# Patient Record
Sex: Male | Born: 1955 | Race: White | Hispanic: No | Marital: Married | Smoking: Current every day smoker
Health system: Southern US, Community
[De-identification: ages and names within clinical notes are randomized; demographics above are authoritative.]

## PROBLEM LIST (undated history)

## (undated) DIAGNOSIS — B1920 Unspecified viral hepatitis C without hepatic coma: Secondary | ICD-10-CM

## (undated) HISTORY — PX: NECK SURGERY: SHX720

## (undated) HISTORY — DX: Unspecified viral hepatitis C without hepatic coma: B19.20

---

## 2004-06-06 ENCOUNTER — Ambulatory Visit: Payer: Self-pay | Admitting: Cardiology

## 2004-06-11 ENCOUNTER — Ambulatory Visit: Payer: Self-pay | Admitting: Cardiology

## 2004-06-12 ENCOUNTER — Ambulatory Visit: Payer: Self-pay | Admitting: Cardiology

## 2004-06-13 ENCOUNTER — Inpatient Hospital Stay (HOSPITAL_BASED_OUTPATIENT_CLINIC_OR_DEPARTMENT_OTHER): Admission: RE | Admit: 2004-06-13 | Discharge: 2004-06-13 | Payer: Self-pay | Admitting: Cardiovascular Disease

## 2004-06-13 ENCOUNTER — Ambulatory Visit: Payer: Self-pay | Admitting: Cardiovascular Disease

## 2004-06-20 ENCOUNTER — Ambulatory Visit: Payer: Self-pay | Admitting: Cardiology

## 2004-11-29 ENCOUNTER — Inpatient Hospital Stay (HOSPITAL_COMMUNITY): Admission: RE | Admit: 2004-11-29 | Discharge: 2004-11-30 | Payer: Self-pay | Admitting: Neurosurgery

## 2010-09-18 ENCOUNTER — Ambulatory Visit (INDEPENDENT_AMBULATORY_CARE_PROVIDER_SITE_OTHER): Payer: Managed Care, Other (non HMO) | Admitting: Internal Medicine

## 2010-09-18 DIAGNOSIS — B182 Chronic viral hepatitis C: Secondary | ICD-10-CM

## 2010-10-02 ENCOUNTER — Other Ambulatory Visit (INDEPENDENT_AMBULATORY_CARE_PROVIDER_SITE_OTHER): Payer: Self-pay | Admitting: Internal Medicine

## 2010-10-02 DIAGNOSIS — B182 Chronic viral hepatitis C: Secondary | ICD-10-CM

## 2010-10-12 ENCOUNTER — Other Ambulatory Visit (INDEPENDENT_AMBULATORY_CARE_PROVIDER_SITE_OTHER): Payer: Self-pay | Admitting: Internal Medicine

## 2010-10-12 ENCOUNTER — Ambulatory Visit (HOSPITAL_COMMUNITY)
Admission: RE | Admit: 2010-10-12 | Discharge: 2010-10-12 | Disposition: A | Discharge status: Home / Self Care | Payer: Managed Care, Other (non HMO) | Source: Ambulatory Visit | Attending: Internal Medicine | Admitting: Internal Medicine

## 2010-10-12 ENCOUNTER — Other Ambulatory Visit: Payer: Self-pay | Admitting: Interventional Radiology

## 2010-10-12 ENCOUNTER — Ambulatory Visit (HOSPITAL_COMMUNITY): Payer: Managed Care, Other (non HMO)

## 2010-10-12 DIAGNOSIS — B182 Chronic viral hepatitis C: Secondary | ICD-10-CM

## 2010-10-12 LAB — CBC
HCT: 47.5 % (ref 39.0–52.0)
MCH: 31.5 pg (ref 26.0–34.0)
MCV: 88.5 fL (ref 78.0–100.0)
RDW: 13.2 % (ref 11.5–15.5)
WBC: 7 10*3/uL (ref 4.0–10.5)

## 2011-10-17 ENCOUNTER — Encounter (INDEPENDENT_AMBULATORY_CARE_PROVIDER_SITE_OTHER): Payer: Self-pay | Admitting: *Deleted

## 2011-10-29 ENCOUNTER — Encounter (INDEPENDENT_AMBULATORY_CARE_PROVIDER_SITE_OTHER): Payer: Managed Care, Other (non HMO) | Admitting: Internal Medicine

## 2012-09-16 ENCOUNTER — Telehealth (INDEPENDENT_AMBULATORY_CARE_PROVIDER_SITE_OTHER): Payer: Self-pay | Admitting: *Deleted

## 2012-09-16 ENCOUNTER — Encounter (INDEPENDENT_AMBULATORY_CARE_PROVIDER_SITE_OTHER): Payer: Self-pay | Admitting: Internal Medicine

## 2012-09-16 ENCOUNTER — Ambulatory Visit (INDEPENDENT_AMBULATORY_CARE_PROVIDER_SITE_OTHER): Payer: Managed Care, Other (non HMO) | Admitting: Internal Medicine

## 2012-09-16 ENCOUNTER — Other Ambulatory Visit (INDEPENDENT_AMBULATORY_CARE_PROVIDER_SITE_OTHER): Payer: Self-pay | Admitting: *Deleted

## 2012-09-16 VITALS — BP 94/56 | HR 72 | Temp 99.2°F | Ht 69.0 in | Wt 186.8 lb

## 2012-09-16 DIAGNOSIS — Z1211 Encounter for screening for malignant neoplasm of colon: Secondary | ICD-10-CM

## 2012-09-16 DIAGNOSIS — B1920 Unspecified viral hepatitis C without hepatic coma: Secondary | ICD-10-CM

## 2012-09-16 LAB — CBC WITH DIFFERENTIAL/PLATELET
Basophils Absolute: 0.1 10*3/uL (ref 0.0–0.1)
Eosinophils Absolute: 0.2 10*3/uL (ref 0.0–0.7)
Eosinophils Relative: 3 % (ref 0–5)
Lymphs Abs: 3.1 10*3/uL (ref 0.7–4.0)
MCH: 31.5 pg (ref 26.0–34.0)
Monocytes Relative: 8 % (ref 3–12)
Neutro Abs: 3.3 10*3/uL (ref 1.7–7.7)
Neutrophils Relative %: 45 % (ref 43–77)
RDW: 14 % (ref 11.5–15.5)
WBC: 7.3 10*3/uL (ref 4.0–10.5)

## 2012-09-16 MED ORDER — PEG-KCL-NACL-NASULF-NA ASC-C 100 G PO SOLR: 1.0000 | Freq: Once | ORAL | Status: DC

## 2012-09-16 NOTE — Progress Notes (Signed)
Subjective:     Patient ID: Ian Stout, male   DOB: [birth year], 57 y.o.   MRN: [id number]  HPIReferred to our office for a screening colonoscopy. He tells me his sister had a colonoscopy with polyps 2 yrs ago.  Also referred for Hepatitis C. Appetite is good. No weight loss. No abdominal pain. BMs are normal. No melena or bright red rectal bleeding.     Diagnosed in 2012 with Hep. C. (Per Dr Patty Sermons notes, he found that his girlfriend years ago had Hepatitis C). He has never received blood transfusions, or tattoos. No history of IV drug [place]. No hx of icteric hepatitis.  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  09/25/2010 Hepatitis C RNA Quant 576000. Genotype 1a HCV antibody greater than 11 (high) 07/18/2010 AST 109, ALT 228, ALP 85  07/20/2010 HBsAg screen negative, Hep B core Ab negative. Review of Systems Current Outpatient Prescriptions  Medication Sig Dispense Refill  . albuterol (PROVENTIL HFA;VENTOLIN HFA) 108 (90 BASE) MCG/ACT inhaler Inhale 2 puffs into the lungs every 6 (six) hours as needed for wheezing.      . sildenafil (VIAGRA) 100 MG tablet Take 100 mg by mouth as needed for erectile dysfunction.      . varenicline (CHANTIX PAK) 0.5 MG X 11 & 1 MG X 42 tablet Take by mouth 2 (two) times daily. Take one 0.5 mg tablet by mouth once daily for 3 days, then increase to one 0.5 mg tablet twice daily for 4 days, then increase to one 1 mg tablet twice daily.       No current facility-administered medications for this visit.   Past Medical History  Diagnosis Date  . Hepatitis C     diagnosed in 2012   Past Surgical History  Procedure Laterality Date  . Neck surgery      bone spur   No Known Allergies      Objective:   Physical Exam  Filed Vitals:   09/16/12 1419  BP: 94/56  Pulse: 72  Temp: 99.2 F (37.3 C)  Height: 5\' 9"  (1.753 m)  Weight: 186 lb 12.8 oz (84.732 kg)    Alert and oriented. Skin warm and dry. Oral mucosa is moist.   . Sclera anicteric, conjunctivae is pink. Thyroid not enlarged. No cervical lymphadenopathy. Lungs clear. Heart regular rate and rhythm.  Abdomen is soft. Bowel sounds are positive. No hepatomegaly. No abdominal masses felt. No tenderness.  No edema to lower extremities.       Assessment:    Hepatitis C.  Screening colonoscopy    Plan:   CBC, PT/INR, AFP, Hep C RNA, cmet OV in  1-2 month with Dr. Karilyn Cota to discuss treatment options.

## 2012-09-16 NOTE — Patient Instructions (Addendum)
CBC, PT/INR, CMET, AFP, Hep C Quant.  OV in 1 month with Dr. Karilyn Cota.

## 2012-09-16 NOTE — Telephone Encounter (Signed)
Patient needs movi prep 

## 2012-09-17 LAB — AFP TUMOR MARKER: AFP-Tumor Marker: 2.5 ng/mL (ref 0.0–8.0)

## 2012-09-17 LAB — PROTIME-INR
INR: 1 (ref <1.50)
Prothrombin Time: 13.2 seconds (ref 11.6–15.2)

## 2012-09-17 LAB — COMPREHENSIVE METABOLIC PANEL
ALT: 82 U/L — ABNORMAL HIGH (ref 0–53)
AST: 47 U/L — ABNORMAL HIGH (ref 0–37)
Albumin: 4.3 g/dL (ref 3.5–5.2)
Alkaline Phosphatase: 75 U/L (ref 39–117)
BUN: 18 mg/dL (ref 6–23)
CO2: 29 mEq/L (ref 19–32)
Calcium: 9.4 mg/dL (ref 8.4–10.5)
Chloride: 102 mEq/L (ref 96–112)
Creat: 0.67 mg/dL (ref 0.50–1.35)
Glucose, Bld: 95 mg/dL (ref 70–99)
Potassium: 4.3 mEq/L (ref 3.5–5.3)
Sodium: 139 mEq/L (ref 135–145)
Total Bilirubin: 0.7 mg/dL (ref 0.3–1.2)
Total Protein: 7.4 g/dL (ref 6.0–8.3)

## 2012-09-18 ENCOUNTER — Encounter (HOSPITAL_COMMUNITY): Payer: Self-pay | Admitting: Pharmacy Technician

## 2012-09-21 ENCOUNTER — Encounter (INDEPENDENT_AMBULATORY_CARE_PROVIDER_SITE_OTHER): Payer: Self-pay | Admitting: *Deleted

## 2012-10-13 ENCOUNTER — Encounter (INDEPENDENT_AMBULATORY_CARE_PROVIDER_SITE_OTHER): Payer: Self-pay

## 2012-10-29 MED ORDER — SODIUM CHLORIDE 0.45 % IV SOLN: INTRAVENOUS | Status: DC

## 2012-11-02 ENCOUNTER — Encounter (HOSPITAL_COMMUNITY)
Admission: RE | Disposition: A | Discharge status: Home / Self Care | Payer: Self-pay | Source: Ambulatory Visit | Attending: Internal Medicine

## 2012-11-02 ENCOUNTER — Encounter (HOSPITAL_COMMUNITY): Payer: Self-pay | Admitting: *Deleted

## 2012-11-02 ENCOUNTER — Ambulatory Visit (HOSPITAL_COMMUNITY)
Admission: RE | Admit: 2012-11-02 | Discharge: 2012-11-02 | Disposition: A | Discharge status: Home / Self Care | Payer: Managed Care, Other (non HMO) | Source: Ambulatory Visit | Attending: Internal Medicine | Admitting: Internal Medicine

## 2012-11-02 DIAGNOSIS — Z8371 Family history of colonic polyps: Secondary | ICD-10-CM

## 2012-11-02 DIAGNOSIS — F17200 Nicotine dependence, unspecified, uncomplicated: Secondary | ICD-10-CM | POA: Insufficient documentation

## 2012-11-02 DIAGNOSIS — Z83719 Family history of colon polyps, unspecified: Secondary | ICD-10-CM | POA: Insufficient documentation

## 2012-11-02 DIAGNOSIS — Z79899 Other long term (current) drug therapy: Secondary | ICD-10-CM | POA: Insufficient documentation

## 2012-11-02 DIAGNOSIS — D126 Benign neoplasm of colon, unspecified: Secondary | ICD-10-CM | POA: Insufficient documentation

## 2012-11-02 DIAGNOSIS — Z1211 Encounter for screening for malignant neoplasm of colon: Secondary | ICD-10-CM

## 2012-11-02 DIAGNOSIS — B1920 Unspecified viral hepatitis C without hepatic coma: Secondary | ICD-10-CM | POA: Insufficient documentation

## 2012-11-02 HISTORY — PX: COLONOSCOPY: SHX5424

## 2012-11-02 SURGERY — COLONOSCOPY
Anesthesia: Moderate Sedation

## 2012-11-02 MED ORDER — STERILE WATER FOR IRRIGATION IR SOLN
Status: DC | PRN
  Administered 2012-11-02: 08:00:00

## 2012-11-02 MED ORDER — MEPERIDINE HCL 50 MG/ML IJ SOLN
INTRAMUSCULAR | Status: AC
  Filled 2012-11-02: qty 1

## 2012-11-02 MED ORDER — MIDAZOLAM HCL 5 MG/5ML IJ SOLN
INTRAMUSCULAR | Status: DC | PRN
  Administered 2012-11-02: 2 mg via INTRAVENOUS
  Administered 2012-11-02: 3 mg via INTRAVENOUS
  Administered 2012-11-02: 2 mg via INTRAVENOUS

## 2012-11-02 MED ORDER — MEPERIDINE HCL 50 MG/ML IJ SOLN
INTRAMUSCULAR | Status: DC | PRN
  Administered 2012-11-02 (×2): 25 mg via INTRAVENOUS

## 2012-11-02 MED ORDER — SODIUM CHLORIDE 0.9 % IV SOLN
INTRAVENOUS | Status: DC
  Administered 2012-11-02: 08:00:00 via INTRAVENOUS

## 2012-11-02 MED ORDER — MIDAZOLAM HCL 5 MG/5ML IJ SOLN
INTRAMUSCULAR | Status: AC
  Filled 2012-11-02: qty 10

## 2012-11-02 NOTE — H&P (Signed)
Ian Stout is an 57 y.o. male.   Chief Complaint: Patient is here for colonoscopy. HPI: Patient is 57 year old Caucasian male who is in for screening colonoscopy. He denies abdominal pain changes bowel habits or rectal bleeding. He has a sister was 7 years older has had colonic polyps removed. Family history is negative for colorectal carcinoma.  Past Medical History  Diagnosis Date  . Hepatitis C     diagnosed in 2012    Past Surgical History  Procedure Laterality Date  . Neck surgery      bone spur    Family History  Problem Relation Age of Onset  . Colon polyps Sister   . Colon cancer Neg Hx    Social History:  reports that he has been smoking.  He does not have any smokeless tobacco history on file. He reports that  drinks alcohol. He reports that he does not use illicit drugs.  Allergies: No Known Allergies  Medications Prior to Admission  Medication Sig Dispense Refill  . albuterol (PROVENTIL HFA;VENTOLIN HFA) 108 (90 BASE) MCG/ACT inhaler Inhale 2 puffs into the lungs every 6 (six) hours as needed for wheezing.      . peg 3350 powder (MOVIPREP) 100 G SOLR Take 1 kit (100 g total) by mouth once.  1 kit  0  . sildenafil (VIAGRA) 100 MG tablet Take 100 mg by mouth as needed for erectile dysfunction.      . varenicline (CHANTIX PAK) 0.5 MG X 11 & 1 MG X 42 tablet Take by mouth 2 (two) times daily. Take one 0.5 mg tablet by mouth once daily for 3 days, then increase to one 0.5 mg tablet twice daily for 4 days, then increase to one 1 mg tablet twice daily.        No results found for this or any previous visit (from the past 48 hour(s)). No results found.  ROS  Blood pressure 125/78, temperature 97.4 F (36.3 C), temperature source Oral, resp. rate 19, height 5\' 9"  (1.753 m), weight 180 lb (81.647 kg), SpO2 94.00%. Physical Exam  Constitutional: He appears well-developed and well-nourished.  HENT:  Mouth/Throat: Oropharynx is clear and moist.  Eyes: Conjunctivae  are normal. No scleral icterus.  Neck: No thyromegaly present.  Cardiovascular: Normal rate, regular rhythm and normal heart sounds.   No murmur heard. Respiratory: Effort normal and breath sounds normal.  GI: Soft. He exhibits no distension and no mass. There is no tenderness.  Musculoskeletal: He exhibits no edema.  Lymphadenopathy:    He has no cervical adenopathy.  Neurological: He is alert.  Skin: Skin is warm and dry.     Assessment/Plan Average risk screening colonoscopy.  Byron Tipping U 11/02/2012, 7:42 AM

## 2012-11-02 NOTE — Op Note (Signed)
COLONOSCOPY PROCEDURE REPORT  PATIENT:  Ian Stout  MR#:  [id number] Birthdate:  December 16, 1955, 57 y.o., male Endoscopist:  Dr. Malissa Hippo, MD Referred By:  Dr. Donzetta Sprung, MD  Procedure Date: 11/02/2012  Procedure:   Colonoscopy  Indications:  Patient is 57 year old Caucasian male was undergoing average risk screening colonoscopy. His sister who is 7 years older had 2 polyps removed 2 years ago.  Informed Consent:  The procedure and risks were reviewed with the patient and informed consent was obtained.  Medications:  Demerol 50 mg IV Versed 7 mg IV  Description of procedure:  After a digital rectal exam was performed, that colonoscope was advanced from the anus through the rectum and colon to the area of the cecum, ileocecal valve and appendiceal orifice. The cecum was deeply intubated. These structures were well-seen and photographed for the record. From the level of the cecum and ileocecal valve, the scope was slowly and cautiously withdrawn. The mucosal surfaces were carefully surveyed utilizing scope tip to flexion to facilitate fold flattening as needed. The scope was pulled down into the rectum where a thorough exam including retroflexion was performed.  Findings:   Prep excellent Small polyp ablated via cold biopsy from mid sigmoid colon. Normal rectal mucosa and anal rectal junction.   Therapeutic/Diagnostic Maneuvers Performed:  See above  Complications:  None  Cecal Withdrawal Time:  9 minutes  Impression:  Examination performed to cecum. Small polyp at mid sigmoid colon otherwise normal colonoscopy. This polyp was removed via cold biopsy.  Recommendations:  Standard instructions given. I will contact patient with biopsy results and further recommendations.  REHMAN,NAJEEB U  11/02/2012 8:07 AM  CC: Dr. Donzetta Sprung, MD & Dr. Bonnetta Barry ref. provider found

## 2012-11-03 ENCOUNTER — Encounter (HOSPITAL_COMMUNITY): Payer: Self-pay | Admitting: Internal Medicine

## 2012-11-10 ENCOUNTER — Encounter (INDEPENDENT_AMBULATORY_CARE_PROVIDER_SITE_OTHER): Payer: Self-pay | Admitting: *Deleted

## 2012-11-16 ENCOUNTER — Ambulatory Visit (INDEPENDENT_AMBULATORY_CARE_PROVIDER_SITE_OTHER): Payer: Managed Care, Other (non HMO) | Admitting: Internal Medicine

## 2012-11-19 ENCOUNTER — Encounter (INDEPENDENT_AMBULATORY_CARE_PROVIDER_SITE_OTHER): Payer: Self-pay | Admitting: Internal Medicine

## 2012-11-19 ENCOUNTER — Ambulatory Visit (INDEPENDENT_AMBULATORY_CARE_PROVIDER_SITE_OTHER): Payer: Managed Care, Other (non HMO) | Admitting: Internal Medicine

## 2012-11-19 VITALS — BP 100/58 | HR 64 | Ht 69.0 in | Wt 180.4 lb

## 2012-11-19 DIAGNOSIS — B1920 Unspecified viral hepatitis C without hepatic coma: Secondary | ICD-10-CM

## 2012-11-19 NOTE — Progress Notes (Signed)
Subjective:     Patient ID: Ian Stout, male   DOB: 05/15/1956, 57 y.o.   MRN: [id number]  HPI Here today to discuss possible Hep C tx. Diagnosed in 2012.  Marland Kitchen He does not have a hx of IV drug use.  He had a girlfriend years ago with Hep C.  No blood transfusion or tattoos. He is genotype 1A.  Liver Biopsy 09/15/10: Chronic Hepatitis, mildly active, (Grade 2) with portal fibrosis (stage 1).  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  09/25/2010 Hepatitis C RNA Quant 576000.  Genotype 1a  HCV antibody greater than 11 (high)  07/18/2010 AST 109, ALT 228, ALP 85  07/20/2010 HBsAg screen negative, Hep B core Ab negative.      Review of Systems  See hpi Current Outpatient Prescriptions  Medication Sig Dispense Refill  . albuterol (PROVENTIL HFA;VENTOLIN HFA) 108 (90 BASE) MCG/ACT inhaler Inhale 2 puffs into the lungs every 6 (six) hours as needed for wheezing.      . sildenafil (VIAGRA) 100 MG tablet Take 100 mg by mouth as needed for erectile dysfunction.       No current facility-administered medications for this visit.   Past Medical History  Diagnosis Date  . Hepatitis C     diagnosed in 2012   Past Surgical History  Procedure Laterality Date  . Neck surgery      bone spur  . Colonoscopy N/A 11/02/2012    Procedure: COLONOSCOPY;  Surgeon: Malissa Hippo, MD;  Location: [facility];  Service: Endoscopy;  Laterality: N/A;  100-rescheduled to [address] notified pt   No Known Allergies       Objective:   Physical Exam  Filed Vitals:   11/19/12 0954  BP: 100/58  Pulse: 64  Height: 5\' 9"  (1.753 m)  Weight: 180 lb 6.4 oz (81.829 kg)  Alert and oriented. Skin warm and dry. Oral mucosa is moist.   . Sclera anicteric, conjunctivae is pink. Thyroid not enlarged. No cervical lymphadenopathy. Lungs clear. Heart regular rate and rhythm.  Abdomen is soft. Bowel sounds are positive. No hepatomegaly. No abdominal masses felt. No  tenderness.  No edema to lower extremities.        Assessment:     Hepatitis C. Genotype  He has a mild disease at this time. I discussed this case with  Dr. Karilyn Cota.    Plan:     OV in February. Hopefully newer Hep C tx will be available. Patient has minimal fibrosis.

## 2012-11-19 NOTE — Patient Instructions (Signed)
OV in February

## 2013-08-23 ENCOUNTER — Encounter (INDEPENDENT_AMBULATORY_CARE_PROVIDER_SITE_OTHER): Payer: Self-pay | Admitting: Internal Medicine

## 2013-08-23 ENCOUNTER — Ambulatory Visit (INDEPENDENT_AMBULATORY_CARE_PROVIDER_SITE_OTHER): Payer: Managed Care, Other (non HMO) | Admitting: Internal Medicine

## 2013-08-23 ENCOUNTER — Other Ambulatory Visit (INDEPENDENT_AMBULATORY_CARE_PROVIDER_SITE_OTHER): Payer: Self-pay | Admitting: Internal Medicine

## 2013-08-23 VITALS — BP 114/66 | HR 64 | Temp 98.3°F | Ht 69.0 in | Wt 189.4 lb

## 2013-08-23 DIAGNOSIS — B1920 Unspecified viral hepatitis C without hepatic coma: Secondary | ICD-10-CM

## 2013-08-23 LAB — TSH: TSH: 0.886 u[IU]/mL (ref 0.350–4.500)

## 2013-08-23 LAB — CBC WITH DIFFERENTIAL/PLATELET
BASOS PCT: 1 % (ref 0–1)
Basophils Absolute: 0 10*3/uL (ref 0.0–0.1)
EOS ABS: 0.2 10*3/uL (ref 0.0–0.7)
EOS PCT: 4 % (ref 0–5)
HCT: 48.2 % (ref 39.0–52.0)
Hemoglobin: 17.1 g/dL — ABNORMAL HIGH (ref 13.0–17.0)
LYMPHS ABS: 2.6 10*3/uL (ref 0.7–4.0)
Lymphocytes Relative: 39 % (ref 12–46)
MCH: 32 pg (ref 26.0–34.0)
MCHC: 35.5 g/dL (ref 30.0–36.0)
MCV: 90.3 fL (ref 78.0–100.0)
MONOS PCT: 7 % (ref 3–12)
Monocytes Absolute: 0.4 10*3/uL (ref 0.1–1.0)
Neutro Abs: 3.4 10*3/uL (ref 1.7–7.7)
Neutrophils Relative %: 49 % (ref 43–77)
PLATELETS: 168 10*3/uL (ref 150–400)
RBC: 5.34 MIL/uL (ref 4.22–5.81)
RDW: 14 % (ref 11.5–15.5)
WBC: 6.8 10*3/uL (ref 4.0–10.5)

## 2013-08-23 LAB — COMPREHENSIVE METABOLIC PANEL
ALBUMIN: 4.2 g/dL (ref 3.5–5.2)
ALK PHOS: 67 U/L (ref 39–117)
ALT: 66 U/L — AB (ref 0–53)
AST: 37 U/L (ref 0–37)
BUN: 17 mg/dL (ref 6–23)
CHLORIDE: 102 meq/L (ref 96–112)
CO2: 31 mEq/L (ref 19–32)
Calcium: 9.4 mg/dL (ref 8.4–10.5)
Creat: 0.69 mg/dL (ref 0.50–1.35)
Glucose, Bld: 97 mg/dL (ref 70–99)
POTASSIUM: 4.7 meq/L (ref 3.5–5.3)
SODIUM: 141 meq/L (ref 135–145)
Total Bilirubin: 0.7 mg/dL (ref 0.2–1.2)
Total Protein: 7.3 g/dL (ref 6.0–8.3)

## 2013-08-23 NOTE — Progress Notes (Signed)
Subjective:     Patient ID: Ian Stout, male   DOB: 04-21-1956, 58 y.o.   MRN: [id number]  HPI Here today for f/u of his Hepatitis C.  Diagnosed in 2012 with Hepatitis. No risk factors.  No tattoos. No IV drug use.  He does tell me an old girlfriend had Hepatitis C 30 yrs ago.  Appetite is good. No weight loss. No abdominal pain.  No jaundice. BMs are normal. No melena or bright red rectal bleeding. Genotype 1A.  10/12/2010 Liver biopsy: Chronic hepatitis, mildly active (grade 2) with portal fibrosis (stage 1).    09/16/2012 HCV Quantitative <15 IU/mL  [id number] (H)   HCV Quantitative Log <1.18 log 10  6.76 (H)   Comments: Genotype 1a  07/20/2010 HBsAg screen negative, Hep B core Ab negative.   CMP     Component Value Date/Time   NA 139 09/16/2012 0305   K 4.3 09/16/2012 0305   CL 102 09/16/2012 0305   CO2 29 09/16/2012 0305   GLUCOSE 95 09/16/2012 0305   BUN 18 09/16/2012 0305   CREATININE 0.67 09/16/2012 0305   CALCIUM 9.4 09/16/2012 0305   PROT 7.4 09/16/2012 0305   ALBUMIN 4.3 09/16/2012 0305   AST 47* 09/16/2012 0305   ALT 82* 09/16/2012 0305   ALKPHOS 75 09/16/2012 0305   BILITOT 0.7 09/16/2012 0305   CBC    Component Value Date/Time   WBC 7.3 09/16/2012 0305   RBC 5.30 09/16/2012 0305   HGB 16.7 09/16/2012 0305   HCT 47.5 09/16/2012 0305   PLT 153 09/16/2012 0305   MCV 89.6 09/16/2012 0305   MCH 31.5 09/16/2012 0305   MCHC 35.2 09/16/2012 0305   RDW 14.0 09/16/2012 0305   LYMPHSABS 3.1 09/16/2012 0305   MONOABS 0.6 09/16/2012 0305   EOSABS 0.2 09/16/2012 0305   BASOSABS 0.1 09/16/2012 0305   .     Review of Systems see hpi Past Medical History  Diagnosis Date  . Hepatitis C     diagnosed in 2012   Past Surgical History  Procedure Laterality Date  . Neck surgery      bone spur  . Colonoscopy N/A 11/02/2012    Procedure: COLONOSCOPY;  Surgeon: Rogene Houston, MD;  Location: [facility];  Service: Endoscopy;  Laterality: N/A;  100-rescheduled to [place] notified pt   No Known Allergies     Objective:   Physical Exam  Filed Vitals:   08/23/13 0926  BP: 114/66  Pulse: 64  Temp: 98.3 F (36.8 C)  Height: 5\' 9"  (1.753 m)  Weight: 189 lb 6.4 oz (85.911 kg)   Alert and oriented. Skin warm and dry. Oral mucosa is moist.   . Sclera anicteric, conjunctivae is pink. Thyroid not enlarged. No cervical lymphadenopathy. Lungs clear. Heart regular rate and rhythm.  Abdomen is soft. Bowel sounds are positive. No hepatomegaly. No abdominal masses felt. No tenderness.  No edema to lower extremities.       Assessment:   Hepatitis C. Genotype 1A. He has decided he would like treatment.    Plan:     CMET, TSH, CBC, Hep C quaint, [place] rt upper quadrant.  Will treat with Rozann Lesches x 12 weeks. He was advised no etoh and that Viagra was contraindicated during treatment.

## 2013-08-23 NOTE — Patient Instructions (Addendum)
Labs. Will try to get Rozann Lesches approved. Further recommendations to follow.  No Viagra during treatment. No etoh

## 2013-08-24 ENCOUNTER — Ambulatory Visit (HOSPITAL_COMMUNITY)
Admission: RE | Admit: 2013-08-24 | Discharge: 2013-08-24 | Disposition: A | Discharge status: Home / Self Care | Payer: Managed Care, Other (non HMO) | Source: Ambulatory Visit | Attending: Internal Medicine | Admitting: Internal Medicine

## 2013-08-24 ENCOUNTER — Other Ambulatory Visit (INDEPENDENT_AMBULATORY_CARE_PROVIDER_SITE_OTHER): Payer: Self-pay | Admitting: Internal Medicine

## 2013-08-24 DIAGNOSIS — B1920 Unspecified viral hepatitis C without hepatic coma: Secondary | ICD-10-CM | POA: Insufficient documentation

## 2013-08-24 DIAGNOSIS — Q619 Cystic kidney disease, unspecified: Secondary | ICD-10-CM | POA: Insufficient documentation

## 2013-08-24 LAB — HEPATITIS C RNA QUANTITATIVE
HCV QUANT LOG: 6.82 {Log} — AB (ref <1.18)
HCV QUANT: 6630484 [IU]/mL — AB (ref <15)

## 2013-09-17 ENCOUNTER — Telehealth (INDEPENDENT_AMBULATORY_CARE_PROVIDER_SITE_OTHER): Payer: Self-pay | Admitting: Internal Medicine

## 2013-09-17 NOTE — Telephone Encounter (Signed)
Will be taking Harvoni x 12 weeks

## 2013-10-06 ENCOUNTER — Encounter (INDEPENDENT_AMBULATORY_CARE_PROVIDER_SITE_OTHER): Payer: Self-pay | Admitting: Internal Medicine

## 2013-10-08 ENCOUNTER — Telehealth (INDEPENDENT_AMBULATORY_CARE_PROVIDER_SITE_OTHER): Payer: Self-pay | Admitting: *Deleted

## 2013-10-08 NOTE — Telephone Encounter (Signed)
Spoke with [employer]. Patient to get his shipment of medication on 10/12/13. I left a message for the patient to call our office, to let us know the first day he starts the medication.

## 2013-10-12 ENCOUNTER — Encounter (INDEPENDENT_AMBULATORY_CARE_PROVIDER_SITE_OTHER): Payer: Self-pay | Admitting: *Deleted

## 2013-10-12 NOTE — Telephone Encounter (Signed)
Patient called and said he has received his medication. The return phone number is [phone].

## 2013-10-12 NOTE — Telephone Encounter (Signed)
Dearl called to let Tammy know he has received his medicine.

## 2013-10-12 NOTE — Telephone Encounter (Signed)
Error

## 2013-10-12 NOTE — Telephone Encounter (Signed)
This encounter was created in error - please disregard.

## 2013-10-28 ENCOUNTER — Telehealth (INDEPENDENT_AMBULATORY_CARE_PROVIDER_SITE_OTHER): Payer: Self-pay | Admitting: *Deleted

## 2013-10-28 DIAGNOSIS — B1920 Unspecified viral hepatitis C without hepatic coma: Secondary | ICD-10-CM

## 2013-10-28 NOTE — Telephone Encounter (Signed)
Patient was called on his cell phone and his home phone. A message was left about his lab work.

## 2013-10-28 NOTE — Telephone Encounter (Signed)
.  Per Lelon Perla to have labs drawn 2 weeks post starting treatment.

## 2014-01-12 LAB — CBC WITH DIFFERENTIAL/PLATELET
BASOS ABS: 0.1 10*3/uL (ref 0.0–0.1)
BASOS PCT: 1 % (ref 0–1)
EOS PCT: 3 % (ref 0–5)
Eosinophils Absolute: 0.2 10*3/uL (ref 0.0–0.7)
HEMATOCRIT: 46.1 % (ref 39.0–52.0)
Hemoglobin: 16.5 g/dL (ref 13.0–17.0)
Lymphocytes Relative: 36 % (ref 12–46)
Lymphs Abs: 3 10*3/uL (ref 0.7–4.0)
MCH: 31.5 pg (ref 26.0–34.0)
MCHC: 35.8 g/dL (ref 30.0–36.0)
MCV: 88.1 fL (ref 78.0–100.0)
MONO ABS: 0.6 10*3/uL (ref 0.1–1.0)
Monocytes Relative: 7 % (ref 3–12)
Neutro Abs: 4.3 10*3/uL (ref 1.7–7.7)
Neutrophils Relative %: 53 % (ref 43–77)
Platelets: 190 10*3/uL (ref 150–400)
RBC: 5.23 MIL/uL (ref 4.22–5.81)
RDW: 14.1 % (ref 11.5–15.5)
WBC: 8.2 10*3/uL (ref 4.0–10.5)

## 2014-01-12 LAB — HEPATIC FUNCTION PANEL
ALBUMIN: 4.1 g/dL (ref 3.5–5.2)
ALT: 12 U/L (ref 0–53)
AST: 17 U/L (ref 0–37)
Alkaline Phosphatase: 62 U/L (ref 39–117)
Bilirubin, Direct: 0.1 mg/dL (ref 0.0–0.3)
Indirect Bilirubin: 0.7 mg/dL (ref 0.2–1.2)
TOTAL PROTEIN: 6.9 g/dL (ref 6.0–8.3)
Total Bilirubin: 0.8 mg/dL (ref 0.2–1.2)

## 2014-01-14 LAB — HEPATITIS C RNA QUANTITATIVE: HCV QUANT: NOT DETECTED [IU]/mL (ref <15)

## 2014-01-17 ENCOUNTER — Encounter (INDEPENDENT_AMBULATORY_CARE_PROVIDER_SITE_OTHER): Payer: Self-pay | Admitting: Internal Medicine

## 2014-01-17 ENCOUNTER — Ambulatory Visit (INDEPENDENT_AMBULATORY_CARE_PROVIDER_SITE_OTHER): Payer: Managed Care, Other (non HMO) | Admitting: Internal Medicine

## 2014-01-17 VITALS — BP 110/70 | HR 64 | Temp 97.8°F | Ht 69.0 in | Wt 188.0 lb

## 2014-01-17 DIAGNOSIS — B182 Chronic viral hepatitis C: Secondary | ICD-10-CM

## 2014-01-17 NOTE — Progress Notes (Signed)
Subjective:     Patient ID: Ian Stout, male   DOB: 1955/10/24, 58 y.o.   MRN: [id number]  HPI Here today for f/u of his Hepatitis C. Started Harvoni x 12 weeks on April 2, and finished January 07, 2014. of this year.  Diagnosed in 2012 with Hepatitis. No risk factors.  No tattoos. No IV drug use.  He does tell me an old girlfriend had Hepatitis C 30 yrs ago.  Appetite is good. No weight loss. No abdominal pain. No jaundice.  BMs are normal. No melena or bright red rectal bleeding.  Genotype 1A.         07/20/2010 HBsAg screen negative, Hep B core Ab negative.   08/25/2013 US abdomen: IMPRESSION:  Right renal cyst.  Mild nodularity to the liver.    10/12/2010 Liver biopsy: Chronic hepatitis, mildly active (grade 2) with portal fibrosis (stage 1).   Hep C Quaint: undetected. 01/12/2014 CBC    Component Value Date/Time   WBC 8.2 01/12/2014 1059   RBC 5.23 01/12/2014 1059   HGB 16.5 01/12/2014 1059   HCT 46.1 01/12/2014 1059   PLT 190 01/12/2014 1059   MCV 88.1 01/12/2014 1059   MCH 31.5 01/12/2014 1059   MCHC 35.8 01/12/2014 1059   RDW 14.1 01/12/2014 1059   LYMPHSABS 3.0 01/12/2014 1059   MONOABS 0.6 01/12/2014 1059   EOSABS 0.2 01/12/2014 1059   BASOSABS 0.1 01/12/2014 1059   Hepatic Function Latest Ref Rng 01/12/2014 08/23/2013 09/16/2012  Total Protein 6.0 - 8.3 g/dL 6.9 7.3 7.4  Albumin 3.5 - 5.2 g/dL 4.1 4.2 4.3  AST 0 - 37 U/L 17 37 47(H)  ALT 0 - 53 U/L 12 66(H) 82(H)  Alk Phosphatase 39 - 117 U/L 62 67 75  Total Bilirubin 0.2 - 1.2 mg/dL 0.8 0.7 0.7  Bilirubin, Direct 0.0 - 0.3 mg/dL 0.1 - -     Review of Systems     Past Medical History  Diagnosis Date  . Hepatitis C     diagnosed in 2012    Past Surgical History  Procedure Laterality Date  . Neck surgery      bone spur  . Colonoscopy N/A 11/02/2012    Procedure: COLONOSCOPY;  Surgeon: Rogene Houston, MD;  Location: [facility];  Service: Endoscopy;  Laterality: N/A;  100-rescheduled to [place] notified pt    No Known  Allergies  Current Outpatient Prescriptions on File Prior to Visit  Medication Sig Dispense Refill  . albuterol (PROVENTIL HFA;VENTOLIN HFA) 108 (90 BASE) MCG/ACT inhaler Inhale 2 puffs into the lungs every 6 (six) hours as needed for wheezing.      . sildenafil (VIAGRA) 100 MG tablet Take 100 mg by mouth as needed for erectile dysfunction.       No current facility-administered medications on file prior to visit.     Objective:   Physical Exam There were no vitals filed for this visit.  Filed Vitals:   01/17/14 1155  BP: 110/70  Pulse: 64  Temp: 97.8 F (36.6 C)  Height: _0  (1.753 m)  Weight: 188 lb (85.276 kg)  Alert and oriented. Skin warm and dry. Oral mucosa is moist.   . Sclera anicteric, conjunctivae is pink. Thyroid not enlarged. No cervical lymphadenopathy. Lungs clear. Heart regular rate and rhythm.  Abdomen is soft. Bowel sounds are positive. No hepatomegaly. No abdominal masses felt. No tenderness.  No edema to lower extremities.        Assessment/Plan Assessment:  Hepatitis C. Viral load undetectable. Patient is doing well.     Plan:     OV in 6 months.   Will need a Hep C quaint, AFP, CBC, Hepatic function  6 months

## 2014-01-17 NOTE — Patient Instructions (Signed)
OV in 6 months. 

## 2014-01-19 ENCOUNTER — Telehealth (INDEPENDENT_AMBULATORY_CARE_PROVIDER_SITE_OTHER): Payer: Self-pay | Admitting: *Deleted

## 2014-01-19 DIAGNOSIS — B1710 Acute hepatitis C without hepatic coma: Secondary | ICD-10-CM

## 2014-01-19 NOTE — Telephone Encounter (Signed)
.  Per Lelon Perla patient to have labs done 6 months. Noted for January 2016.

## 2014-04-29 ENCOUNTER — Other Ambulatory Visit: Payer: Self-pay

## 2014-06-16 ENCOUNTER — Telehealth (INDEPENDENT_AMBULATORY_CARE_PROVIDER_SITE_OTHER): Payer: Self-pay | Admitting: *Deleted

## 2014-06-16 DIAGNOSIS — B182 Chronic viral hepatitis C: Secondary | ICD-10-CM

## 2014-06-16 NOTE — Telephone Encounter (Signed)
.  Per Lelon Perla to have labs drawn.

## 2014-06-30 LAB — CBC
HCT: 49.2 % (ref 39.0–52.0)
Hemoglobin: 17 g/dL (ref 13.0–17.0)
MCH: 30.8 pg (ref 26.0–34.0)
MCHC: 34.6 g/dL (ref 30.0–36.0)
MCV: 89.1 fL (ref 78.0–100.0)
MPV: 11.3 fL (ref 9.4–12.4)
Platelets: 199 10*3/uL (ref 150–400)
RBC: 5.52 MIL/uL (ref 4.22–5.81)
RDW: 14.2 % (ref 11.5–15.5)
WBC: 9.6 10*3/uL (ref 4.0–10.5)

## 2014-06-30 LAB — HEPATIC FUNCTION PANEL
ALBUMIN: 4.5 g/dL (ref 3.5–5.2)
ALK PHOS: 66 U/L (ref 39–117)
ALT: 11 U/L (ref 0–53)
AST: 14 U/L (ref 0–37)
Bilirubin, Direct: 0.1 mg/dL (ref 0.0–0.3)
Indirect Bilirubin: 0.3 mg/dL (ref 0.2–1.2)
TOTAL PROTEIN: 7.4 g/dL (ref 6.0–8.3)
Total Bilirubin: 0.4 mg/dL (ref 0.2–1.2)

## 2014-06-30 LAB — AFP TUMOR MARKER: AFP TUMOR MARKER: 1.3 ng/mL (ref <6.1)

## 2014-06-30 LAB — HEPATITIS C RNA QUANTITATIVE: HCV Quantitative: NOT DETECTED IU/mL (ref <15)

## 2014-07-11 ENCOUNTER — Ambulatory Visit (INDEPENDENT_AMBULATORY_CARE_PROVIDER_SITE_OTHER): Payer: Managed Care, Other (non HMO) | Admitting: Internal Medicine

## 2014-07-11 ENCOUNTER — Encounter (INDEPENDENT_AMBULATORY_CARE_PROVIDER_SITE_OTHER): Payer: Self-pay | Admitting: Internal Medicine

## 2014-07-11 VITALS — BP 118/64 | HR 72 | Temp 98.6°F | Ht 69.5 in | Wt 187.8 lb

## 2014-07-11 DIAGNOSIS — B1920 Unspecified viral hepatitis C without hepatic coma: Secondary | ICD-10-CM

## 2014-07-11 NOTE — Progress Notes (Signed)
Subjective:    Patient ID: Ian Stout, male    DOB: 1956/06/08, 58 y.o.   MRN: [id number]  HPI Here today for f/u of his Hepatitis C. Finished Harvoni In June of this year. Treated for 12 weeks. Diagnosed with Hepatitis C in 2012. No risk factors. No IV drug use or tatoos. Hx of a girlfriend with Hep C 30 years ago. Genotype 1A. Liver biopsy 10/12/2010 Chronic hepatitis, mild active (grade 2) with portal fibrosis (stage 1). 06/29/2014 Hep C quaint undetected. 01/12/2014 Hep C quaint undetected  Says everything is good. Working full time at [employer] in [place]. No problems. Appetite is good. BMs are normal.  No weight loss. Hepatic function this month is normal. Viral load undetectable     CBC    Component Value Date/Time   WBC 9.6 06/29/2014 1100   RBC 5.52 06/29/2014 1100   HGB 17.0 06/29/2014 1100   HCT 49.2 06/29/2014 1100   PLT 199 06/29/2014 1100   MCV 89.1 06/29/2014 1100   MCH 30.8 06/29/2014 1100   MCHC 34.6 06/29/2014 1100   RDW 14.2 06/29/2014 1100   LYMPHSABS 3.0 01/12/2014 1059   MONOABS 0.6 01/12/2014 1059   EOSABS 0.2 01/12/2014 1059   BASOSABS 0.1 01/12/2014 1059   Hepatic Function Panel     Component Value Date/Time   PROT 7.4 06/29/2014 1100   ALBUMIN 4.5 06/29/2014 1100   AST 14 06/29/2014 1100   ALT 11 06/29/2014 1100   ALKPHOS 66 06/29/2014 1100   BILITOT 0.4 06/29/2014 1100   BILIDIR 0.1 06/29/2014 1100   IBILI 0.3 06/29/2014 1100    .06/29/2014 AFP 1.3        07/20/2010 HBsAg screen negative, Hep B core Ab negative.   08/25/2013 US abdomen: IMPRESSION:  Right renal cyst.  Mild nodularity to the liver.    10/12/2010 Liver biopsy: Chronic hepatitis, mildly active (grade 2) with portal fibrosis (stage 1).       Review of Systems   Past Medical History  Diagnosis Date  . Hepatitis C     diagnosed in 2012    Past Surgical History  Procedure Laterality Date  . Neck surgery      bone spur  . Colonoscopy N/A  11/02/2012    Procedure: COLONOSCOPY;  Surgeon: Rogene Houston, MD;  Location: [facility];  Service: Endoscopy;  Laterality: N/A;  100-rescheduled to [place] notified pt    No Known Allergies  Current Outpatient Prescriptions on File Prior to Visit  Medication Sig Dispense Refill  . albuterol (PROVENTIL HFA;VENTOLIN HFA) 108 (90 BASE) MCG/ACT inhaler Inhale 2 puffs into the lungs every 6 (six) hours as needed for wheezing.    . sildenafil (VIAGRA) 100 MG tablet Take 100 mg by mouth as needed for erectile dysfunction.     No current facility-administered medications on file prior to visit.        Objective:   Physical Exam  Filed Vitals:   07/11/14 1050  Height: 5' 9.5" (1.765 m)  Weight: 187 lb 12.8 oz (85.186 kg)    Alert and oriented. Skin warm and dry. Oral mucosa is moist.   . Sclera anicteric, conjunctivae is pink. Thyroid not enlarged. No cervical lymphadenopathy. Bilateral wheezes. Heart regular rate and rhythm.  Abdomen is soft. Bowel sounds are positive. No hepatomegaly. No abdominal masses felt. No tenderness.  No edema to lower extremities.         Assessment & Plan:  Hepatitis C. He remains in  remission. Viral load undetectable. Hepatic function is normal. OV in one year. ( I wanted to see him back before this but he wanted to wait for a year).

## 2014-07-11 NOTE — Patient Instructions (Addendum)
OV in 1 yr with Hepatic function and Hep C quaint, CBC, AFP

## 2014-07-12 ENCOUNTER — Encounter (INDEPENDENT_AMBULATORY_CARE_PROVIDER_SITE_OTHER): Payer: Self-pay | Admitting: *Deleted

## 2014-07-12 ENCOUNTER — Other Ambulatory Visit (INDEPENDENT_AMBULATORY_CARE_PROVIDER_SITE_OTHER): Payer: Self-pay | Admitting: *Deleted

## 2014-07-12 DIAGNOSIS — B1710 Acute hepatitis C without hepatic coma: Secondary | ICD-10-CM

## 2014-07-26 ENCOUNTER — Ambulatory Visit (INDEPENDENT_AMBULATORY_CARE_PROVIDER_SITE_OTHER): Payer: Managed Care, Other (non HMO) | Admitting: Internal Medicine

## 2015-05-25 IMAGING — US US ABDOMEN LIMITED
1 series · 14 of 25 positions shown · non-contrast
Comparison: None.

CLINICAL DATA: History of hepatitis-C

EXAM:
US ABDOMEN LIMITED - RIGHT UPPER QUADRANT

[Series 1: us abdomen limited · 0.17mm/px · 14 of 49 slices shown]
[im 1/49]
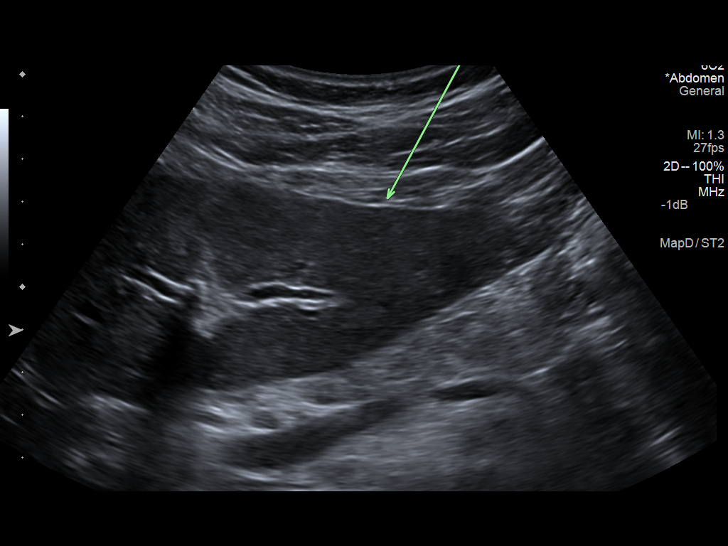
[im 5/49]
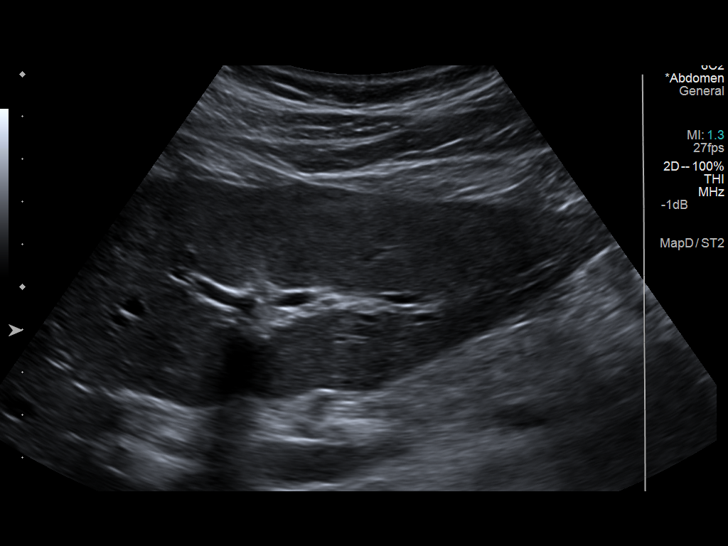
[im 9/49]
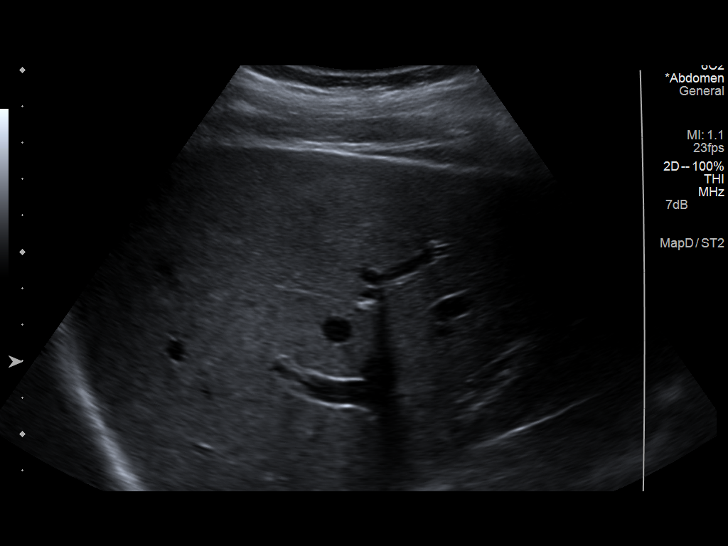
[im 13/49]
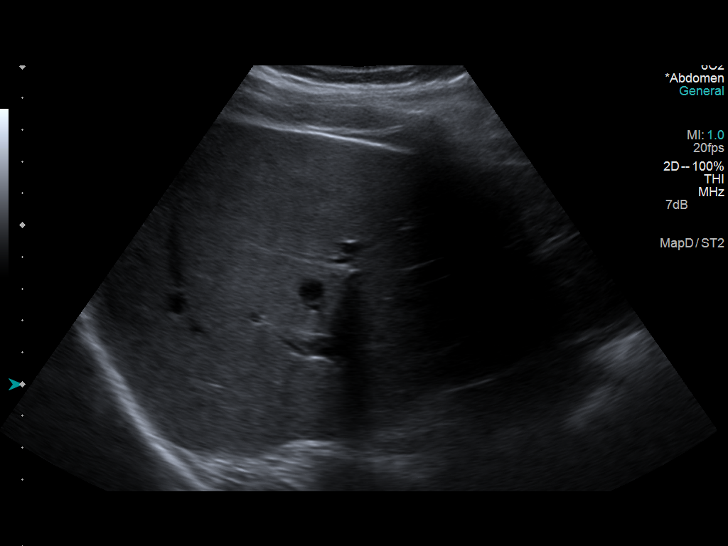
[im 17/49]
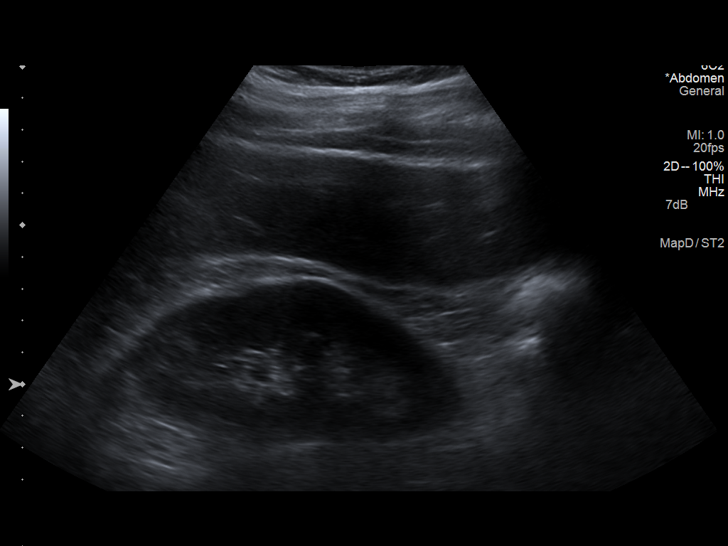
[im 19/49]
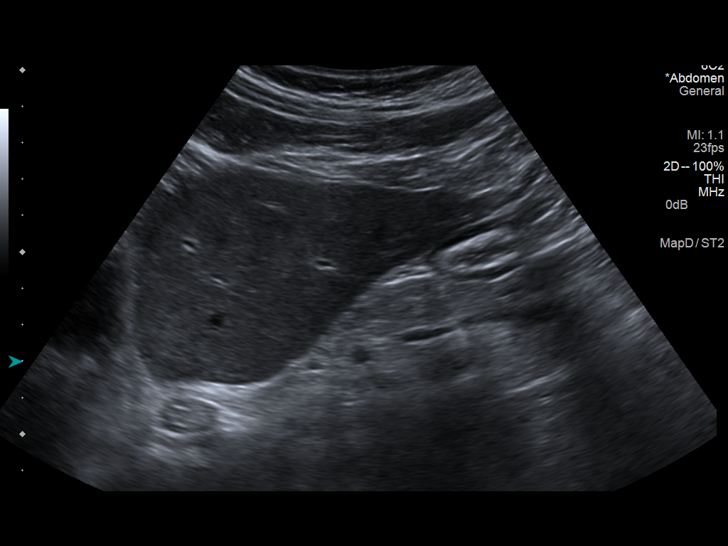
[im 23/49]
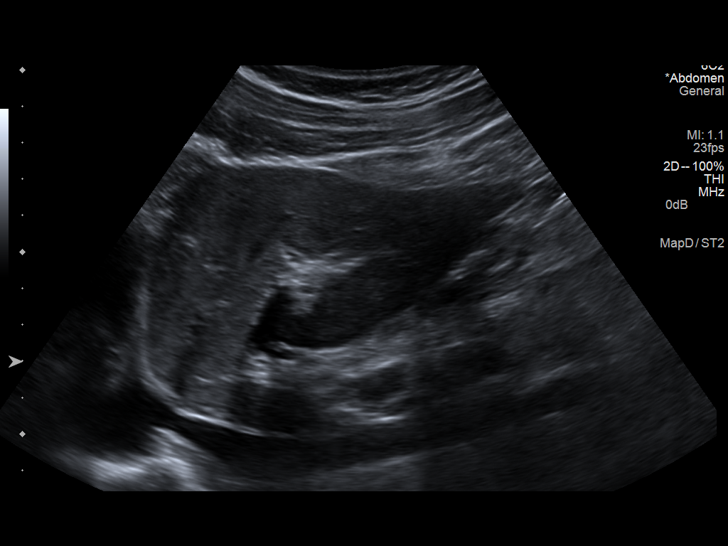
[im 27/49]
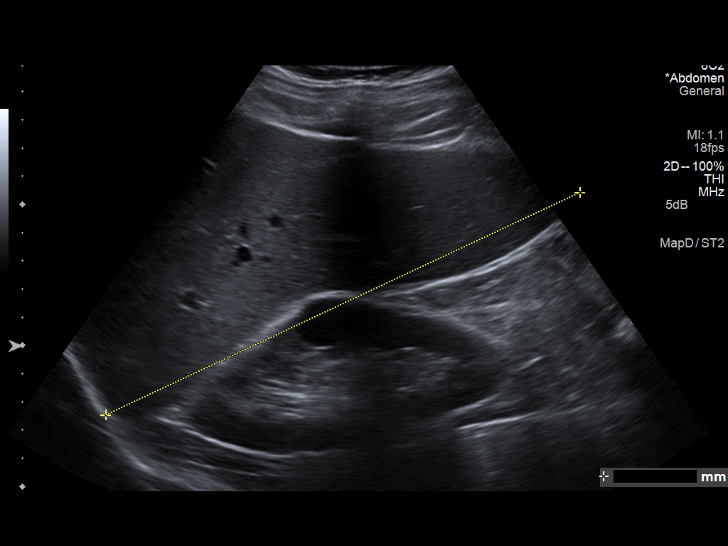
[im 31/49]
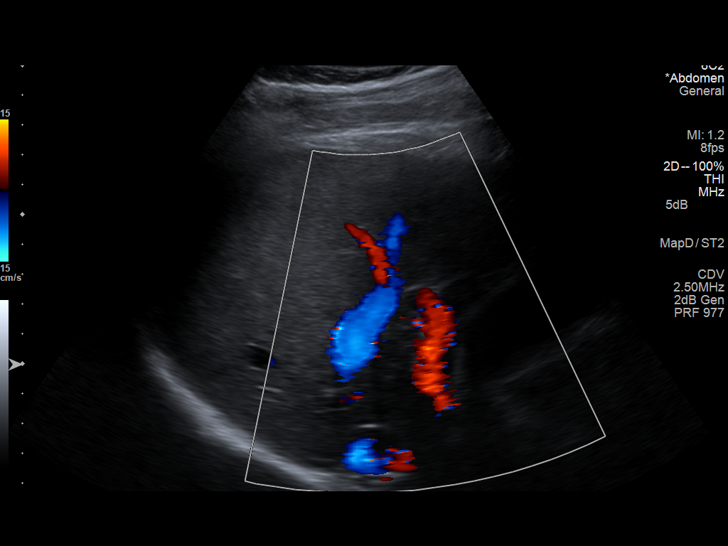
[im 33/49]
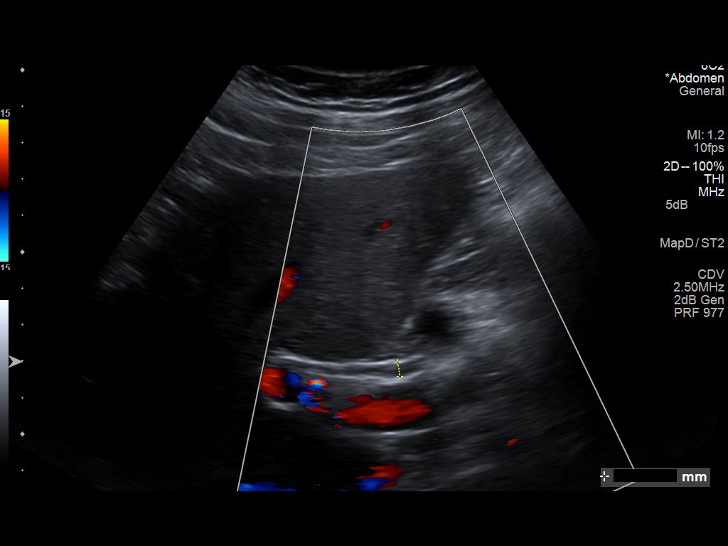
[im 37/49]
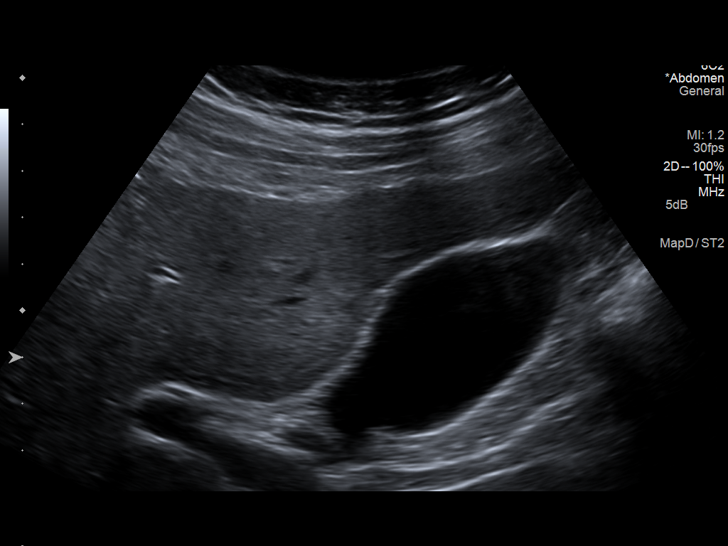
[im 41/49]
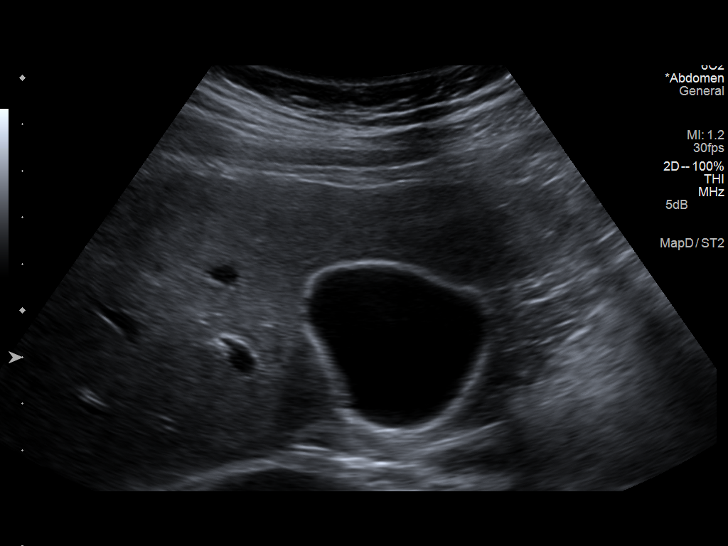
[im 45/49]
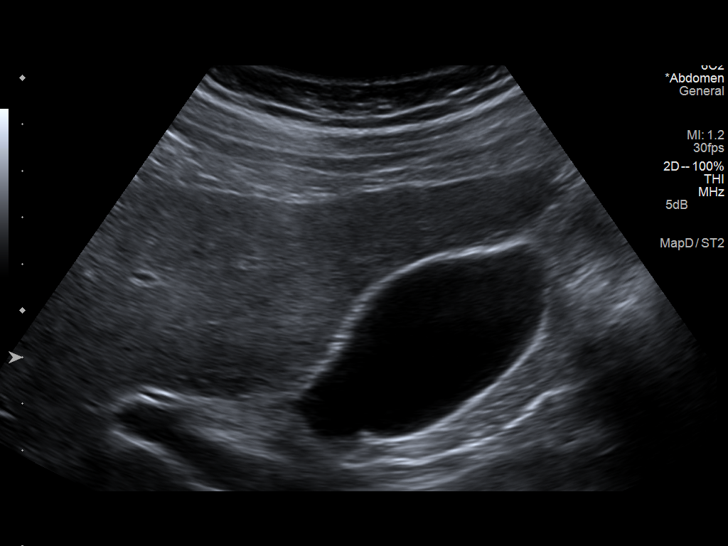
[im 49/49]
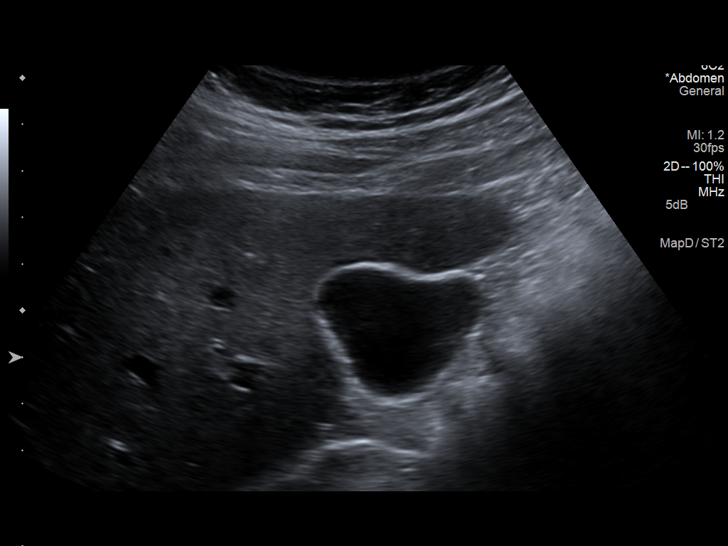

[14 of 25 positions shown; findings below may reference images not displayed]

FINDINGS: Gallbladder:

No gallstones or wall thickening visualized. No sonographic Murphy
sign noted.

Common bile duct:

Diameter: 3.9 mm.

Liver:

Mild nodularity is noted.  No definitive mass lesion is seen.

Incidental note is made of a small right renal cyst.
IMPRESSION: Right renal cyst.

Mild nodularity to the liver.

## 2019-10-11 ENCOUNTER — Encounter (INDEPENDENT_AMBULATORY_CARE_PROVIDER_SITE_OTHER): Payer: Self-pay | Admitting: *Deleted

## 2020-11-28 DIAGNOSIS — L03113 Cellulitis of right upper limb: Secondary | ICD-10-CM | POA: Diagnosis not present

## 2020-11-28 DIAGNOSIS — R600 Localized edema: Secondary | ICD-10-CM | POA: Diagnosis not present

## 2020-11-28 DIAGNOSIS — Z6830 Body mass index (BMI) 30.0-30.9, adult: Secondary | ICD-10-CM | POA: Diagnosis not present

## 2020-11-30 DIAGNOSIS — L03113 Cellulitis of right upper limb: Secondary | ICD-10-CM | POA: Diagnosis not present

## 2020-11-30 DIAGNOSIS — R600 Localized edema: Secondary | ICD-10-CM | POA: Diagnosis not present

## 2020-11-30 DIAGNOSIS — Z6829 Body mass index (BMI) 29.0-29.9, adult: Secondary | ICD-10-CM | POA: Diagnosis not present

## 2020-12-12 DIAGNOSIS — E8881 Metabolic syndrome: Secondary | ICD-10-CM | POA: Diagnosis not present

## 2020-12-12 DIAGNOSIS — Z1159 Encounter for screening for other viral diseases: Secondary | ICD-10-CM | POA: Diagnosis not present

## 2020-12-12 DIAGNOSIS — E7849 Other hyperlipidemia: Secondary | ICD-10-CM | POA: Diagnosis not present

## 2020-12-12 DIAGNOSIS — Z1329 Encounter for screening for other suspected endocrine disorder: Secondary | ICD-10-CM | POA: Diagnosis not present

## 2020-12-12 DIAGNOSIS — E782 Mixed hyperlipidemia: Secondary | ICD-10-CM | POA: Diagnosis not present

## 2020-12-12 DIAGNOSIS — R7301 Impaired fasting glucose: Secondary | ICD-10-CM | POA: Diagnosis not present

## 2020-12-12 DIAGNOSIS — Z125 Encounter for screening for malignant neoplasm of prostate: Secondary | ICD-10-CM | POA: Diagnosis not present

## 2020-12-15 DIAGNOSIS — E8881 Metabolic syndrome: Secondary | ICD-10-CM | POA: Diagnosis not present

## 2020-12-15 DIAGNOSIS — J449 Chronic obstructive pulmonary disease, unspecified: Secondary | ICD-10-CM | POA: Diagnosis not present

## 2020-12-15 DIAGNOSIS — E7849 Other hyperlipidemia: Secondary | ICD-10-CM | POA: Diagnosis not present

## 2020-12-15 DIAGNOSIS — R69 Illness, unspecified: Secondary | ICD-10-CM | POA: Diagnosis not present

## 2020-12-15 DIAGNOSIS — R7301 Impaired fasting glucose: Secondary | ICD-10-CM | POA: Diagnosis not present

## 2020-12-15 DIAGNOSIS — Z0001 Encounter for general adult medical examination with abnormal findings: Secondary | ICD-10-CM | POA: Diagnosis not present

## 2020-12-15 DIAGNOSIS — I700 Atherosclerosis of aorta: Secondary | ICD-10-CM | POA: Diagnosis not present

## 2020-12-15 DIAGNOSIS — Z23 Encounter for immunization: Secondary | ICD-10-CM | POA: Diagnosis not present

## 2021-01-17 DIAGNOSIS — R69 Illness, unspecified: Secondary | ICD-10-CM | POA: Diagnosis not present

## 2021-04-04 DIAGNOSIS — J441 Chronic obstructive pulmonary disease with (acute) exacerbation: Secondary | ICD-10-CM | POA: Diagnosis not present

## 2021-04-04 DIAGNOSIS — R69 Illness, unspecified: Secondary | ICD-10-CM | POA: Diagnosis not present

## 2021-04-04 DIAGNOSIS — Z20828 Contact with and (suspected) exposure to other viral communicable diseases: Secondary | ICD-10-CM | POA: Diagnosis not present

## 2022-06-17 DIAGNOSIS — J449 Chronic obstructive pulmonary disease, unspecified: Secondary | ICD-10-CM | POA: Diagnosis not present

## 2022-06-17 DIAGNOSIS — Z1329 Encounter for screening for other suspected endocrine disorder: Secondary | ICD-10-CM | POA: Diagnosis not present

## 2022-06-17 DIAGNOSIS — E7849 Other hyperlipidemia: Secondary | ICD-10-CM | POA: Diagnosis not present

## 2022-06-17 DIAGNOSIS — R69 Illness, unspecified: Secondary | ICD-10-CM | POA: Diagnosis not present

## 2022-06-17 DIAGNOSIS — Z125 Encounter for screening for malignant neoplasm of prostate: Secondary | ICD-10-CM | POA: Diagnosis not present

## 2022-06-17 DIAGNOSIS — R7301 Impaired fasting glucose: Secondary | ICD-10-CM | POA: Diagnosis not present

## 2022-06-20 DIAGNOSIS — J449 Chronic obstructive pulmonary disease, unspecified: Secondary | ICD-10-CM | POA: Diagnosis not present

## 2022-06-20 DIAGNOSIS — R509 Fever, unspecified: Secondary | ICD-10-CM | POA: Diagnosis not present

## 2022-06-20 DIAGNOSIS — R030 Elevated blood-pressure reading, without diagnosis of hypertension: Secondary | ICD-10-CM | POA: Diagnosis not present

## 2022-06-20 DIAGNOSIS — Z0001 Encounter for general adult medical examination with abnormal findings: Secondary | ICD-10-CM | POA: Diagnosis not present

## 2022-06-20 DIAGNOSIS — R7301 Impaired fasting glucose: Secondary | ICD-10-CM | POA: Diagnosis not present

## 2022-06-20 DIAGNOSIS — R059 Cough, unspecified: Secondary | ICD-10-CM | POA: Diagnosis not present

## 2022-06-20 DIAGNOSIS — R69 Illness, unspecified: Secondary | ICD-10-CM | POA: Diagnosis not present

## 2022-06-20 DIAGNOSIS — Z20828 Contact with and (suspected) exposure to other viral communicable diseases: Secondary | ICD-10-CM | POA: Diagnosis not present

## 2022-06-20 DIAGNOSIS — E7849 Other hyperlipidemia: Secondary | ICD-10-CM | POA: Diagnosis not present

## 2022-06-20 DIAGNOSIS — E8881 Metabolic syndrome: Secondary | ICD-10-CM | POA: Diagnosis not present

## 2022-06-20 DIAGNOSIS — I700 Atherosclerosis of aorta: Secondary | ICD-10-CM | POA: Diagnosis not present

## 2022-06-20 DIAGNOSIS — J441 Chronic obstructive pulmonary disease with (acute) exacerbation: Secondary | ICD-10-CM | POA: Diagnosis not present

## 2022-06-29 DIAGNOSIS — K0889 Other specified disorders of teeth and supporting structures: Secondary | ICD-10-CM | POA: Diagnosis not present

## 2022-07-05 DIAGNOSIS — R030 Elevated blood-pressure reading, without diagnosis of hypertension: Secondary | ICD-10-CM | POA: Diagnosis not present

## 2022-07-05 DIAGNOSIS — E8881 Metabolic syndrome: Secondary | ICD-10-CM | POA: Diagnosis not present

## 2022-07-05 DIAGNOSIS — Z23 Encounter for immunization: Secondary | ICD-10-CM | POA: Diagnosis not present

## 2022-07-05 DIAGNOSIS — Z0001 Encounter for general adult medical examination with abnormal findings: Secondary | ICD-10-CM | POA: Diagnosis not present

## 2022-07-05 DIAGNOSIS — E7849 Other hyperlipidemia: Secondary | ICD-10-CM | POA: Diagnosis not present

## 2022-07-05 DIAGNOSIS — R7301 Impaired fasting glucose: Secondary | ICD-10-CM | POA: Diagnosis not present

## 2022-07-05 DIAGNOSIS — R69 Illness, unspecified: Secondary | ICD-10-CM | POA: Diagnosis not present

## 2022-07-05 DIAGNOSIS — Z6829 Body mass index (BMI) 29.0-29.9, adult: Secondary | ICD-10-CM | POA: Diagnosis not present

## 2022-07-05 DIAGNOSIS — I700 Atherosclerosis of aorta: Secondary | ICD-10-CM | POA: Diagnosis not present

## 2022-07-05 DIAGNOSIS — J449 Chronic obstructive pulmonary disease, unspecified: Secondary | ICD-10-CM | POA: Diagnosis not present

## 2022-07-05 DIAGNOSIS — Z1212 Encounter for screening for malignant neoplasm of rectum: Secondary | ICD-10-CM | POA: Diagnosis not present

## 2022-08-06 DIAGNOSIS — J189 Pneumonia, unspecified organism: Secondary | ICD-10-CM | POA: Diagnosis not present

## 2022-08-06 DIAGNOSIS — R69 Illness, unspecified: Secondary | ICD-10-CM | POA: Diagnosis not present

## 2022-08-12 ENCOUNTER — Other Ambulatory Visit (HOSPITAL_COMMUNITY): Payer: Self-pay | Admitting: Family Medicine

## 2022-08-12 DIAGNOSIS — R918 Other nonspecific abnormal finding of lung field: Secondary | ICD-10-CM

## 2022-08-20 ENCOUNTER — Encounter (HOSPITAL_BASED_OUTPATIENT_CLINIC_OR_DEPARTMENT_OTHER): Payer: Self-pay

## 2022-08-20 ENCOUNTER — Ambulatory Visit (HOSPITAL_BASED_OUTPATIENT_CLINIC_OR_DEPARTMENT_OTHER)
Admission: RE | Admit: 2022-08-20 | Discharge: 2022-08-20 | Disposition: A | Discharge status: Home / Self Care | Payer: Medicare HMO | Source: Ambulatory Visit | Attending: Family Medicine | Admitting: Family Medicine

## 2022-08-20 DIAGNOSIS — R918 Other nonspecific abnormal finding of lung field: Secondary | ICD-10-CM | POA: Diagnosis not present

## 2022-08-20 DIAGNOSIS — J439 Emphysema, unspecified: Secondary | ICD-10-CM | POA: Diagnosis not present

## 2022-08-20 MED ORDER — IOHEXOL 300 MG/ML  SOLN
100.0000 mL | Freq: Once | INTRAMUSCULAR | Status: AC | PRN
  Administered 2022-08-20: 75 mL via INTRAVENOUS

## 2022-09-23 DIAGNOSIS — L309 Dermatitis, unspecified: Secondary | ICD-10-CM | POA: Diagnosis not present

## 2022-11-19 ENCOUNTER — Other Ambulatory Visit (HOSPITAL_COMMUNITY): Payer: Self-pay | Admitting: Family Medicine

## 2022-11-19 DIAGNOSIS — R918 Other nonspecific abnormal finding of lung field: Secondary | ICD-10-CM

## 2022-12-04 ENCOUNTER — Encounter (HOSPITAL_BASED_OUTPATIENT_CLINIC_OR_DEPARTMENT_OTHER): Payer: Self-pay

## 2022-12-04 ENCOUNTER — Ambulatory Visit (HOSPITAL_BASED_OUTPATIENT_CLINIC_OR_DEPARTMENT_OTHER)
Admission: RE | Admit: 2022-12-04 | Discharge: 2022-12-04 | Disposition: A | Discharge status: Home / Self Care | Payer: Medicare HMO | Source: Ambulatory Visit | Attending: Family Medicine | Admitting: Family Medicine

## 2022-12-04 DIAGNOSIS — R918 Other nonspecific abnormal finding of lung field: Secondary | ICD-10-CM | POA: Insufficient documentation

## 2022-12-04 DIAGNOSIS — J439 Emphysema, unspecified: Secondary | ICD-10-CM | POA: Diagnosis not present

## 2022-12-04 MED ORDER — IOHEXOL 300 MG/ML  SOLN
100.0000 mL | Freq: Once | INTRAMUSCULAR | Status: AC | PRN
  Administered 2022-12-04: 75 mL via INTRAVENOUS

## 2022-12-25 DIAGNOSIS — J449 Chronic obstructive pulmonary disease, unspecified: Secondary | ICD-10-CM | POA: Diagnosis not present

## 2022-12-25 DIAGNOSIS — R7301 Impaired fasting glucose: Secondary | ICD-10-CM | POA: Diagnosis not present

## 2022-12-25 DIAGNOSIS — E7849 Other hyperlipidemia: Secondary | ICD-10-CM | POA: Diagnosis not present

## 2023-01-01 DIAGNOSIS — Z23 Encounter for immunization: Secondary | ICD-10-CM | POA: Diagnosis not present

## 2023-01-01 DIAGNOSIS — E8881 Metabolic syndrome: Secondary | ICD-10-CM | POA: Diagnosis not present

## 2023-01-01 DIAGNOSIS — E7849 Other hyperlipidemia: Secondary | ICD-10-CM | POA: Diagnosis not present

## 2023-01-01 DIAGNOSIS — R7301 Impaired fasting glucose: Secondary | ICD-10-CM | POA: Diagnosis not present

## 2023-01-01 DIAGNOSIS — R030 Elevated blood-pressure reading, without diagnosis of hypertension: Secondary | ICD-10-CM | POA: Diagnosis not present

## 2023-01-01 DIAGNOSIS — J449 Chronic obstructive pulmonary disease, unspecified: Secondary | ICD-10-CM | POA: Diagnosis not present

## 2023-01-01 DIAGNOSIS — F17210 Nicotine dependence, cigarettes, uncomplicated: Secondary | ICD-10-CM | POA: Diagnosis not present

## 2023-01-01 DIAGNOSIS — Z6829 Body mass index (BMI) 29.0-29.9, adult: Secondary | ICD-10-CM | POA: Diagnosis not present

## 2023-01-01 DIAGNOSIS — I700 Atherosclerosis of aorta: Secondary | ICD-10-CM | POA: Diagnosis not present

## 2023-07-22 DIAGNOSIS — Z720 Tobacco use: Secondary | ICD-10-CM | POA: Diagnosis not present

## 2023-07-22 DIAGNOSIS — Z1329 Encounter for screening for other suspected endocrine disorder: Secondary | ICD-10-CM | POA: Diagnosis not present

## 2023-07-22 DIAGNOSIS — Z125 Encounter for screening for malignant neoplasm of prostate: Secondary | ICD-10-CM | POA: Diagnosis not present

## 2023-07-22 DIAGNOSIS — E8881 Metabolic syndrome: Secondary | ICD-10-CM | POA: Diagnosis not present

## 2023-07-22 DIAGNOSIS — J449 Chronic obstructive pulmonary disease, unspecified: Secondary | ICD-10-CM | POA: Diagnosis not present

## 2023-07-22 DIAGNOSIS — Z1321 Encounter for screening for nutritional disorder: Secondary | ICD-10-CM | POA: Diagnosis not present

## 2023-07-22 DIAGNOSIS — R7301 Impaired fasting glucose: Secondary | ICD-10-CM | POA: Diagnosis not present

## 2023-07-22 DIAGNOSIS — Z0001 Encounter for general adult medical examination with abnormal findings: Secondary | ICD-10-CM | POA: Diagnosis not present

## 2023-07-22 DIAGNOSIS — E7849 Other hyperlipidemia: Secondary | ICD-10-CM | POA: Diagnosis not present

## 2023-07-29 DIAGNOSIS — R030 Elevated blood-pressure reading, without diagnosis of hypertension: Secondary | ICD-10-CM | POA: Diagnosis not present

## 2023-07-29 DIAGNOSIS — Z6833 Body mass index (BMI) 33.0-33.9, adult: Secondary | ICD-10-CM | POA: Diagnosis not present

## 2023-07-29 DIAGNOSIS — J449 Chronic obstructive pulmonary disease, unspecified: Secondary | ICD-10-CM | POA: Diagnosis not present

## 2023-07-29 DIAGNOSIS — F17210 Nicotine dependence, cigarettes, uncomplicated: Secondary | ICD-10-CM | POA: Diagnosis not present

## 2023-07-29 DIAGNOSIS — E782 Mixed hyperlipidemia: Secondary | ICD-10-CM | POA: Diagnosis not present

## 2023-07-29 DIAGNOSIS — Z23 Encounter for immunization: Secondary | ICD-10-CM | POA: Diagnosis not present

## 2023-07-29 DIAGNOSIS — Z0000 Encounter for general adult medical examination without abnormal findings: Secondary | ICD-10-CM | POA: Diagnosis not present

## 2023-08-12 DIAGNOSIS — Z1211 Encounter for screening for malignant neoplasm of colon: Secondary | ICD-10-CM | POA: Diagnosis not present

## 2023-08-12 DIAGNOSIS — Z1212 Encounter for screening for malignant neoplasm of rectum: Secondary | ICD-10-CM | POA: Diagnosis not present

## 2023-09-13 ENCOUNTER — Ambulatory Visit (HOSPITAL_COMMUNITY)
Admission: EM | Admit: 2023-09-13 | Discharge: 2023-09-13 | Disposition: A | Discharge status: Home / Self Care | Attending: Emergency Medicine | Admitting: Emergency Medicine

## 2023-09-13 ENCOUNTER — Encounter (HOSPITAL_COMMUNITY)
Admission: EM | Disposition: A | Discharge status: Home / Self Care | Payer: Self-pay | Source: Home / Self Care | Attending: Emergency Medicine

## 2023-09-13 ENCOUNTER — Emergency Department (HOSPITAL_COMMUNITY)

## 2023-09-13 ENCOUNTER — Telehealth: Payer: Self-pay | Admitting: Internal Medicine

## 2023-09-13 ENCOUNTER — Emergency Department (HOSPITAL_COMMUNITY): Admitting: Anesthesiology

## 2023-09-13 DIAGNOSIS — R1310 Dysphagia, unspecified: Secondary | ICD-10-CM | POA: Diagnosis not present

## 2023-09-13 DIAGNOSIS — J984 Other disorders of lung: Secondary | ICD-10-CM | POA: Diagnosis not present

## 2023-09-13 DIAGNOSIS — F419 Anxiety disorder, unspecified: Secondary | ICD-10-CM | POA: Insufficient documentation

## 2023-09-13 DIAGNOSIS — W44F3XA Food entering into or through a natural orifice, initial encounter: Secondary | ICD-10-CM

## 2023-09-13 DIAGNOSIS — T18128A Food in esophagus causing other injury, initial encounter: Secondary | ICD-10-CM | POA: Insufficient documentation

## 2023-09-13 DIAGNOSIS — K2970 Gastritis, unspecified, without bleeding: Secondary | ICD-10-CM | POA: Insufficient documentation

## 2023-09-13 DIAGNOSIS — F17200 Nicotine dependence, unspecified, uncomplicated: Secondary | ICD-10-CM | POA: Diagnosis not present

## 2023-09-13 DIAGNOSIS — R0602 Shortness of breath: Secondary | ICD-10-CM | POA: Diagnosis not present

## 2023-09-13 DIAGNOSIS — J449 Chronic obstructive pulmonary disease, unspecified: Secondary | ICD-10-CM | POA: Diagnosis not present

## 2023-09-13 DIAGNOSIS — R1013 Epigastric pain: Secondary | ICD-10-CM

## 2023-09-13 DIAGNOSIS — K222 Esophageal obstruction: Secondary | ICD-10-CM | POA: Diagnosis not present

## 2023-09-13 DIAGNOSIS — B1920 Unspecified viral hepatitis C without hepatic coma: Secondary | ICD-10-CM | POA: Diagnosis not present

## 2023-09-13 DIAGNOSIS — K2210 Ulcer of esophagus without bleeding: Secondary | ICD-10-CM

## 2023-09-13 DIAGNOSIS — J9811 Atelectasis: Secondary | ICD-10-CM | POA: Diagnosis not present

## 2023-09-13 HISTORY — PX: ESOPHAGOGASTRODUODENOSCOPY (EGD) WITH PROPOFOL: SHX5813

## 2023-09-13 LAB — CBC WITH DIFFERENTIAL/PLATELET
Abs Immature Granulocytes: 0.04 10*3/uL (ref 0.00–0.07)
Basophils Absolute: 0.1 10*3/uL (ref 0.0–0.1)
Basophils Relative: 1 %
Eosinophils Absolute: 0.2 10*3/uL (ref 0.0–0.5)
Eosinophils Relative: 1 %
HCT: 49.6 % (ref 39.0–52.0)
Hemoglobin: 17.3 g/dL — ABNORMAL HIGH (ref 13.0–17.0)
Immature Granulocytes: 0 %
Lymphocytes Relative: 22 %
Lymphs Abs: 2.9 10*3/uL (ref 0.7–4.0)
MCH: 30.8 pg (ref 26.0–34.0)
MCHC: 34.9 g/dL (ref 30.0–36.0)
MCV: 88.3 fL (ref 80.0–100.0)
Monocytes Absolute: 0.6 10*3/uL (ref 0.1–1.0)
Monocytes Relative: 4 %
Neutro Abs: 9.3 10*3/uL — ABNORMAL HIGH (ref 1.7–7.7)
Neutrophils Relative %: 72 %
Platelets: 230 10*3/uL (ref 150–400)
RBC: 5.62 MIL/uL (ref 4.22–5.81)
RDW: 14.1 % (ref 11.5–15.5)
WBC: 13.1 10*3/uL — ABNORMAL HIGH (ref 4.0–10.5)
nRBC: 0 % (ref 0.0–0.2)

## 2023-09-13 LAB — BASIC METABOLIC PANEL
Anion gap: 14 (ref 5–15)
BUN: 18 mg/dL (ref 8–23)
CO2: 23 mmol/L (ref 22–32)
Calcium: 9.6 mg/dL (ref 8.9–10.3)
Chloride: 104 mmol/L (ref 98–111)
Creatinine, Ser: 0.68 mg/dL (ref 0.61–1.24)
GFR, Estimated: >60 mL/min (ref >60)
Glucose, Bld: 131 mg/dL — ABNORMAL HIGH (ref 70–99)
Potassium: 3.7 mmol/L (ref 3.5–5.1)
Sodium: 141 mmol/L (ref 135–145)

## 2023-09-13 SURGERY — ESOPHAGOGASTRODUODENOSCOPY (EGD) WITH PROPOFOL
Anesthesia: General

## 2023-09-13 MED ORDER — SODIUM CHLORIDE 0.9% FLUSH: 3.0000 mL | INTRAVENOUS | Status: DC | PRN

## 2023-09-13 MED ORDER — PANTOPRAZOLE SODIUM 40 MG PO TBEC: 40.0000 mg | DELAYED_RELEASE_TABLET | Freq: Two times a day (BID) | ORAL | 11 refills | Status: DC

## 2023-09-13 MED ORDER — MIDAZOLAM HCL 2 MG/2ML IJ SOLN
INTRAMUSCULAR | Status: AC
  Filled 2023-09-13: qty 2

## 2023-09-13 MED ORDER — PROPOFOL 10 MG/ML IV BOLUS
INTRAVENOUS | Status: DC | PRN
  Administered 2023-09-13: 200 mg via INTRAVENOUS

## 2023-09-13 MED ORDER — LIDOCAINE HCL (PF) 2 % IJ SOLN
INTRAMUSCULAR | Status: AC
  Filled 2023-09-13: qty 5

## 2023-09-13 MED ORDER — SUCCINYLCHOLINE CHLORIDE 20 MG/ML IJ SOLN
INTRAMUSCULAR | Status: DC | PRN
  Administered 2023-09-13: 120 mg via INTRAVENOUS

## 2023-09-13 MED ORDER — FENTANYL CITRATE (PF) 100 MCG/2ML IJ SOLN
INTRAMUSCULAR | Status: AC
Start: 2023-09-13 — End: ?
  Filled 2023-09-13: qty 2

## 2023-09-13 MED ORDER — SODIUM CHLORIDE 0.9% FLUSH: 3.0000 mL | Freq: Two times a day (BID) | INTRAVENOUS | Status: DC

## 2023-09-13 MED ORDER — MIDAZOLAM HCL 5 MG/5ML IJ SOLN
INTRAMUSCULAR | Status: DC | PRN
  Administered 2023-09-13: 2 mg via INTRAVENOUS

## 2023-09-13 MED ORDER — GLUCAGON HCL RDNA (DIAGNOSTIC) 1 MG IJ SOLR
1.0000 mg | Freq: Once | INTRAMUSCULAR | Status: AC
  Administered 2023-09-13: 1 mg via INTRAVENOUS
  Filled 2023-09-13: qty 1

## 2023-09-13 MED ORDER — LIDOCAINE HCL (CARDIAC) PF 100 MG/5ML IV SOSY
PREFILLED_SYRINGE | INTRAVENOUS | Status: DC | PRN
  Administered 2023-09-13: 100 mg via INTRAVENOUS

## 2023-09-13 MED ORDER — PHENYLEPHRINE 80 MCG/ML (10ML) SYRINGE FOR IV PUSH (FOR BLOOD PRESSURE SUPPORT)
PREFILLED_SYRINGE | INTRAVENOUS | Status: AC
  Filled 2023-09-13: qty 10

## 2023-09-13 MED ORDER — PHENYLEPHRINE HCL (PRESSORS) 10 MG/ML IV SOLN
INTRAVENOUS | Status: DC | PRN
  Administered 2023-09-13 (×2): 80 ug via INTRAVENOUS

## 2023-09-13 MED ORDER — PROPOFOL 10 MG/ML IV BOLUS
INTRAVENOUS | Status: AC
  Filled 2023-09-13: qty 20

## 2023-09-13 MED ORDER — FENTANYL CITRATE (PF) 100 MCG/2ML IJ SOLN
INTRAMUSCULAR | Status: DC | PRN
Start: 2023-09-13 — End: 2023-09-13
  Administered 2023-09-13 (×2): 50 ug via INTRAVENOUS

## 2023-09-13 MED ORDER — LACTATED RINGERS IV SOLN: INTRAVENOUS | Status: DC | PRN

## 2023-09-13 MED ORDER — SUCCINYLCHOLINE CHLORIDE 200 MG/10ML IV SOSY
PREFILLED_SYRINGE | INTRAVENOUS | Status: AC
  Filled 2023-09-13: qty 10

## 2023-09-13 NOTE — Op Note (Signed)
 [facility] Patient Name: Ian Stout Procedure Date: 09/13/2023 11:22 AM MRN: [id number] Date of Birth: [birth year] Attending MD: [name] , [place], [id number] CSN: [id number] Age: 68 Admit Type: Outpatient Procedure:                Upper GI endoscopy Indications:              Foreign body in the esophagus Providers:                [name], DO, Nena Polio, RN, Durwin Glaze Tech, Technician Referring MD:              Medicines:                See the Anesthesia note for documentation of the                            administered medications Complications:            No immediate complications. Estimated Blood Loss:     Estimated blood loss was minimal. Procedure:                Pre-Anesthesia Assessment:                           - The anesthesia plan was to use general anesthesia.                           After obtaining informed consent, the endoscope was                            passed under direct vision. Throughout the                            procedure, the patient's blood pressure, pulse, and                            oxygen saturations were monitored continuously. The                            GIF-H190 ([id number]) scope was introduced through the                            mouth, and advanced to the second part of duodenum.                            The upper GI endoscopy was accomplished without                            difficulty. The patient tolerated the procedure                            well. Scope In: 11:57:27 AM Scope Out: 12:10:56 PM Total Procedure Duration: 0 hours 13 minutes 29 seconds  Findings:      Food was found in the  lower third of the esophagus. Removal was       accomplished with a talon grasper. Remainder of food gently pushed into       stomach with endoscope.      One esophageal ulcer non-bleeding, will clot material was found at the       gastroesophageal junction. The lesion was 6 mm in  largest dimension.      Patchy mild inflammation characterized by erythema was found in the       gastric body.      The duodenal bulb, first portion of the duodenum and second portion of       the duodenum were normal. Impression:               - Food in the lower third of the esophagus. Removal                            was successful.                           - Esophageal ulcer non-bleeding, will clot material.                           - Gastritis.                           - Normal duodenal bulb, first portion of the                            duodenum and second portion of the duodenum. Moderate Sedation:      Per Anesthesia Care Recommendation:           - Patient has a contact number available for                            emergencies. The signs and symptoms of potential                            delayed complications were discussed with the                            patient. Return to normal activities tomorrow.                            Written discharge instructions were provided to the                            patient.                           - Soft diet.                           - Use Protonix (pantoprazole) 40 mg PO BID.                           - Repeat upper endoscopy in 8 weeks to evaluate the  response to therapy. Can perform colonoscopy at                            same time given history of adenomatous colon polyp                            2014.                           - Return to GI office in 4 weeks. Procedure Code(s):        --- Professional ---                           [phone], Esophagogastroduodenoscopy, flexible,                            transoral; with removal of foreign body(s) Diagnosis Code(s):        --- Professional ---                           X91.478G, Food in esophagus causing other injury,                            initial encounter                           K22.10, Ulcer of esophagus without bleeding                            K29.70, Gastritis, unspecified, without bleeding                           T18.108A, Unspecified foreign body in esophagus                            causing other injury, initial encounter CPT copyright 2022 American Medical Association. All rights reserved. The codes documented in this report are preliminary and upon coder review may  be revised to meet current compliance requirements. [name], DO [name], DO 09/13/2023 12:25:57 PM This report has been signed electronically. Number of Addenda: 0

## 2023-09-13 NOTE — Anesthesia Preprocedure Evaluation (Addendum)
 Anesthesia Evaluation  Patient identified by MRN, date of birth, ID band Patient awake    Reviewed: Allergy & Precautions, H&P , NPO status , Patient's Chart, lab work & pertinent test results, reviewed documented beta blocker date and time   Airway Mallampati: II  TM Distance: >3 FB Neck ROM: full    Dental  (+) Dental Advisory Given, Poor Dentition   Pulmonary COPD, Current Smoker Mild COPD   Pulmonary exam normal breath sounds clear to auscultation       Cardiovascular Exercise Tolerance: Good negative cardio ROS Normal cardiovascular exam Rhythm:regular Rate:Normal     Neuro/Psych negative neurological ROS  negative psych ROS   GI/Hepatic negative GI ROS,,,(+) Hepatitis -, C  Endo/Other  negative endocrine ROS    Renal/GU negative Renal ROS  negative genitourinary   Musculoskeletal   Abdominal   Peds  Hematology negative hematology ROS (+)   Anesthesia Other Findings   Reproductive/Obstetrics negative OB ROS                             Anesthesia Physical Anesthesia Plan  ASA: 2 and emergent  Anesthesia Plan: General   Post-op Pain Management: Minimal or no pain anticipated   Induction: Intravenous and Rapid sequence  PONV Risk Score and Plan: Midazolam, Ondansetron and Dexamethasone  Airway Management Planned: Oral ETT  Additional Equipment: None  Intra-op Plan:   Post-operative Plan: Extubation in OR  Informed Consent: I have reviewed the patients History and Physical, chart, labs and discussed the procedure including the risks, benefits and alternatives for the proposed anesthesia with the patient or authorized representative who has indicated his/her understanding and acceptance.     Dental Advisory Given  Plan Discussed with: CRNA  Anesthesia Plan Comments:         Anesthesia Quick Evaluation

## 2023-09-13 NOTE — Discharge Instructions (Signed)
 EGD Discharge instructions Please read the instructions outlined below and refer to this sheet in the next few weeks. These discharge instructions provide you with general information on caring for yourself after you leave the hospital. Your doctor may also give you specific instructions. While your treatment has been planned according to the most current medical practices available, unavoidable complications occasionally occur. If you have any problems or questions after discharge, please call your doctor. ACTIVITY You may resume your regular activity but move at a slower pace for the next 24 hours.  Take frequent rest periods for the next 24 hours.  Walking will help expel (get rid of) the air and reduce the bloated feeling in your abdomen.  No driving for 24 hours (because of the anesthesia (medicine) used during the test).  You may shower.  Do not sign any important legal documents or operate any machinery for 24 hours (because of the anesthesia used during the test).  NUTRITION Drink plenty of fluids.  You may resume your normal diet.  Begin with a light meal and progress to your normal diet.  Avoid alcoholic beverages for 24 hours or as instructed by your caregiver.  MEDICATIONS You may resume your normal medications unless your caregiver tells you otherwise.  WHAT YOU CAN EXPECT TODAY You may experience abdominal discomfort such as a feeling of fullness or "gas" pains.  FOLLOW-UP Your doctor will discuss the results of your test with you.  SEEK IMMEDIATE MEDICAL ATTENTION IF ANY OF THE FOLLOWING OCCUR: Excessive nausea (feeling sick to your stomach) and/or vomiting.  Severe abdominal pain and distention (swelling).  Trouble swallowing.  Temperature over 101 F (37.8 C).  Rectal bleeding or vomiting of blood.   Your upper endoscopy revealed moderate amount of food in your esophagus.  I was able to remove the large chunk of food which was causing obstruction.  I was then able to  gently push the remainder of food in your stomach.  Your esophagus is now clear.    Upon reevaluation, you have an ulcer at the distal portion of your esophagus.  Mild gastritis (inflammation in your stomach).  Normal small bowel.  I am going to start you on a new medication called pantoprazole 40 mg twice daily and have sent this to your pharmacy.    Follow-up in GI office in 3 to 4 weeks.  We will need to repeat upper endoscopy in 8 to 10 weeks to evaluate healing.   Recommend you avoid tough textures. All meats should be chopped finely. Eat slowly, take small bites, chew thoroughly, and drink plenty of liquids throughout meals. If something were to get hung in your esophagus and not come up or go down, you should proceed to the emergency room.  I hope you have a great rest of your week!  [name], D.O. Gastroenterology and Hepatology [facility]

## 2023-09-13 NOTE — Anesthesia Postprocedure Evaluation (Signed)
 Anesthesia Post Note  Patient: Ian Stout  Procedure(s) Performed: ESOPHAGOGASTRODUODENOSCOPY (EGD) WITH PROPOFOL  Patient location during evaluation: PACU Anesthesia Type: General Level of consciousness: awake and alert Pain management: pain level controlled Vital Signs Assessment: post-procedure vital signs reviewed and stable Respiratory status: spontaneous breathing, nonlabored ventilation, respiratory function stable and patient connected to nasal cannula oxygen Cardiovascular status: blood pressure returned to baseline and stable Postop Assessment: no apparent nausea or vomiting Anesthetic complications: no   There were no known notable events for this encounter.   Last Vitals:  Vitals:   09/13/23 1247 09/13/23 1249  BP:  133/73  Pulse: 90 90  Resp: 16 18  Temp: 36.9 C 36.9 C  SpO2:      Last Pain:  Vitals:   09/13/23 1249  TempSrc: Oral  PainSc:                  Gaetano Hawthorne

## 2023-09-13 NOTE — ED Triage Notes (Signed)
 Patient arrives from home with wife bedside with complaints of having difficulty swallowing that began at 630pm last night, he was eating steak and he couldn't swallow it. Attempted to drink water and he couldn't swallow that either, became nausea and vomited x1. Reports lots of mucus and salvia build up that causes him to constantly spit up.

## 2023-09-13 NOTE — Consult Note (Addendum)
 Consulting  Provider: Burgess Amor Primary Care Physician:  Richardean Chimera, MD Primary Gastroenterologist: Previously Dr. Karilyn Cota  Reason for Consultation: Esophageal food impaction  HPI:  Ian Stout is a 68 y.o. male with a past medical history of COPD who presented to [facility], ER this morning with complaints of esophageal food impaction.  Patient states he was eating steak last night when he felt a piece get stuck in his substernal region.  Notes nausea and vomiting regurgitation since that time.  Unable to sleep all night.  Unable to tolerate secretions.  No prior esophageal food impaction.  Denies any chronic GERD or dysphagia at baseline.  Reports epigastric discomfort.  No melena hematochezia.  No chronic NSAID use.  No previous upper endoscopy.  Colonoscopy 11/02/2012 with 1 tubular adenoma removed from the sigmoid colon.  No family history of colorectal malignancy.  Past Medical History:  Diagnosis Date   Hepatitis C    diagnosed in 2012    Past Surgical History:  Procedure Laterality Date   COLONOSCOPY N/A 11/02/2012   Procedure: COLONOSCOPY;  Surgeon: Malissa Hippo, MD;  Location: [facility];  Service: Endoscopy;  Laterality: N/A;  100-rescheduled to 730 Ann notified pt   NECK SURGERY     bone spur    Prior to Admission medications   Medication Sig Start Date End Date Taking? Authorizing Provider  albuterol (PROVENTIL HFA;VENTOLIN HFA) 108 (90 BASE) MCG/ACT inhaler Inhale 2 puffs into the lungs every 6 (six) hours as needed for wheezing.    [provider]  sildenafil (VIAGRA) 100 MG tablet Take 100 mg by mouth as needed for erectile dysfunction.    [provider]    No current facility-administered medications for this encounter.   Current Outpatient Medications  Medication Sig Dispense Refill   albuterol (PROVENTIL HFA;VENTOLIN HFA) 108 (90 BASE) MCG/ACT inhaler Inhale 2 puffs into the lungs every 6 (six) hours as needed for wheezing.      sildenafil (VIAGRA) 100 MG tablet Take 100 mg by mouth as needed for erectile dysfunction.      Allergies as of 09/13/2023 - Review Complete 09/13/2023  Allergen Reaction Noted   Cat dander Itching 09/13/2023    Family History  Problem Relation Age of Onset   Colon polyps Sister    Colon cancer Neg Hx     Social History   Socioeconomic History   Marital status: Married    Spouse name: Not on file   Number of children: Not on file   Years of education: Not on file   Highest education level: Not on file  Occupational History   Not on file  Tobacco Use   Smoking status: Every Day   Smokeless tobacco: Not on file  Substance and Sexual Activity   Alcohol use: Yes    Alcohol/week: 0.0 standard drinks of alcohol    Comment: rare beer after a round golf   Drug use: No   Sexual activity: Not on file  Other Topics Concern   Not on file  Social History Narrative   Not on file   Social Drivers of Health   Financial Resource Strain: Not on file  Food Insecurity: Not on file  Transportation Needs: Not on file  Physical Activity: Not on file  Stress: Not on file  Social Connections: Not on file  Intimate Partner Violence: Not on file    Review of Systems: General: Negative for anorexia, weight loss, fever, chills, fatigue, weakness. Eyes: Negative for vision  changes.  ENT: Negative for hoarseness, difficulty swallowing , nasal congestion. CV: Negative for chest pain, angina, palpitations, dyspnea on exertion, peripheral edema.  Respiratory: Negative for dyspnea at rest, dyspnea on exertion, cough, sputum, wheezing.  GI: See history of present illness. GU:  Negative for dysuria, hematuria, urinary incontinence, urinary frequency, nocturnal urination.  MS: Negative for joint pain, low back pain.  Derm: Negative for rash or itching.  Neuro: Negative for weakness, abnormal sensation, seizure, frequent headaches, memory loss, confusion.  Psych: Negative for anxiety,  depression Endo: Negative for unusual weight change.  Heme: Negative for bruising or bleeding. Allergy: Negative for rash or hives.  Physical Exam: Vital signs in last 24 hours: Temp:  [98.3 F (36.8 C)] 98.3 F (36.8 C) (03/01 0947) Pulse Rate:  [77-88] 77 (03/01 1030) BP: (114-132)/(61-85) 129/80 (03/01 1030) SpO2:  [88 %-94 %] 88 % (03/01 1030) Weight:  [88.5 kg] 88.5 kg (03/01 0951)   General:   Alert,  Well-developed, well-nourished, pleasant and cooperative in NAD Head:  Normocephalic and atraumatic. Eyes:  Sclera clear, no icterus.   Conjunctiva pink. Ears:  Normal auditory acuity. Nose:  No deformity, discharge,  or lesions. Mouth:  No deformity or lesions, dentition normal. Neck:  Supple; no masses or thyromegaly. Lungs:  Clear throughout to auscultation.   No wheezes, crackles, or rhonchi. No acute distress. Heart:  Regular rate and rhythm; no murmurs, clicks, rubs,  or gallops. Abdomen:  Soft, nontender and nondistended. No masses, hepatosplenomegaly or hernias noted. Normal bowel sounds, without guarding, and without rebound.   Msk:  Symmetrical without gross deformities. Normal posture. Pulses:  Normal pulses noted. Extremities:  Without clubbing or edema. Neurologic:  Alert and  oriented x4;  grossly normal neurologically. Skin:  Intact without significant lesions or rashes. Cervical Nodes:  No significant cervical adenopathy. Psych:  Alert and cooperative. Normal mood and affect.  Intake/Output from previous day: No intake/output data recorded. Intake/Output this shift: No intake/output data recorded.  Lab Results: Recent Labs    09/13/23 1023  WBC 13.1*  HGB 17.3*  HCT 49.6  PLT 230   BMET Recent Labs    09/13/23 1023  NA 141  K 3.7  CL 104  CO2 23  GLUCOSE 131*  BUN 18  CREATININE 0.68  CALCIUM 9.6   LFT No results for input(s): "PROT", "ALBUMIN", "AST", "ALT", "ALKPHOS", "BILITOT", "BILIDIR", "IBILI" in the last 72 hours. PT/INR No  results for input(s): "LABPROT", "INR" in the last 72 hours. Hepatitis Panel No results for input(s): "HEPBSAG", "HCVAB", "HEPAIGM", "HEPBIGM" in the last 72 hours. C-Diff No results for input(s): "CDIFFTOX" in the last 72 hours.  Studies/Results: No results found.  Assessment: Esophageal food impaction  Plan: Will proceed with urgent endoscopy to remove esophageal food impaction.  Discussed potential of esophageal dilation depending on findings.  The risks including infection, bleed, or perforation as well as benefits, limitations, alternatives and imponderables have been reviewed with the patient. Potential for esophageal dilation, biopsy, etc. have also been reviewed.  Questions have been answered. All parties agreeable.  Keep patient NPO.  Needs follow-up in GI office to discuss colonoscopy for colon cancer screening.  [name], D.O. Gastroenterology and Hepatology [facility]    LOS: 0 days     09/13/2023, 11:24 AM

## 2023-09-13 NOTE — ED Notes (Signed)
 Patient able to communicate and express concerns, lung sounds ausculted and a pleural rub noted in LLL of lung base.

## 2023-09-13 NOTE — Telephone Encounter (Signed)
 Please arrange office visit with me in 3 to 4 weeks.  Thank you

## 2023-09-13 NOTE — Transfer of Care (Signed)
 Immediate Anesthesia Transfer of Care Note  Patient: Ian Stout  Procedure(s) Performed: ESOPHAGOGASTRODUODENOSCOPY (EGD) WITH PROPOFOL  Patient Location: PACU  Anesthesia Type:General  Level of Consciousness: awake and sedated  Airway & Oxygen Therapy: Patient connected to nasal cannula oxygen and Patient connected to face mask oxygen  Post-op Assessment: Post -op Vital signs reviewed and stable  Post vital signs: Reviewed  Last Vitals:  Vitals Value Taken Time  BP 110/55 09/13/23 1223  Temp 37 C 09/13/23 1221  Pulse 79 09/13/23 1229  Resp 17 09/13/23 1229  SpO2 95 % 09/13/23 1229  Vitals shown include unfiled device data.  Last Pain:  Vitals:   09/13/23 1137  TempSrc:   PainSc: 4          Complications: No notable events documented.

## 2023-09-13 NOTE — ED Provider Notes (Signed)
 [place] EMERGENCY DEPARTMENT AT [facility] Provider Note   CSN: [id number] Arrival date & time: 09/13/23  4098     History  Chief Complaint  Patient presents with   Dysphagia    CRIMSON BEER is a 68 y.o. male with a history significant only for hepatitis C and COPD presenting for evaluation of suspected steak lodged in his esophagus.  He ate steak for dinner last night, and feels a piece of steak lodged, describing pressure sensation in his lower chest and inability to swallow his saliva.  He states he is intermittently regurgitating his saliva.  He also feels somewhat short of breath.  He denies fevers or chills, denies coughing.  Denies history of acid reflux or prior similar symptoms.  The history is provided by the patient.       Home Medications Prior to Admission medications   Medication Sig Start Date End Date Taking? Authorizing Provider  albuterol (PROVENTIL HFA;VENTOLIN HFA) 108 (90 BASE) MCG/ACT inhaler Inhale 2 puffs into the lungs every 6 (six) hours as needed for wheezing.    [provider]  sildenafil (VIAGRA) 100 MG tablet Take 100 mg by mouth as needed for erectile dysfunction.    [provider]      Allergies    Cat dander    Review of Systems   Review of Systems  Constitutional:  Negative for chills and fever.  HENT:  Positive for trouble swallowing. Negative for congestion and sore throat.   Eyes: Negative.   Respiratory:  Positive for chest tightness and shortness of breath.   Cardiovascular:  Negative for chest pain.  Gastrointestinal:  Negative for abdominal pain, nausea and vomiting.  Genitourinary: Negative.   Musculoskeletal:  Negative for arthralgias, joint swelling and neck pain.  Skin: Negative.  Negative for rash and wound.  Neurological:  Negative for dizziness, weakness, light-headedness, numbness and headaches.  Psychiatric/Behavioral: Negative.      Physical Exam Updated Vital Signs BP 129/80    Pulse 77   Temp 98.3 F (36.8 C) (Oral)   Ht 5\' 9"  (1.753 m)   Wt 88.5 kg   SpO2 (!) 88%   BMI 28.80 kg/m  Physical Exam Vitals and nursing note reviewed.  Constitutional:      General: He is not in acute distress.    Appearance: He is well-developed.     Comments: Appears anxious  HENT:     Head: Normocephalic and atraumatic.  Eyes:     Conjunctiva/sclera: Conjunctivae normal.  Cardiovascular:     Rate and Rhythm: Normal rate and regular rhythm.     Heart sounds: Normal heart sounds.  Pulmonary:     Effort: Pulmonary effort is normal.     Breath sounds: Wheezing present.     Comments: Mild expiratory wheeze.  Pt speaking in full sentences.  No stridor Abdominal:     General: Bowel sounds are normal.     Palpations: Abdomen is soft.     Tenderness: There is no abdominal tenderness.  Musculoskeletal:        General: Normal range of motion.     Cervical back: Normal range of motion.  Skin:    General: Skin is warm and dry.  Neurological:     Mental Status: He is alert.     ED Results / Procedures / Treatments   Labs (all labs ordered are listed, but only abnormal results are displayed) Labs Reviewed  CBC WITH DIFFERENTIAL/PLATELET - Abnormal; Notable for the following components:  Result Value   WBC 13.1 (*)    Hemoglobin 17.3 (*)    Neutro Abs 9.3 (*)    All other components within normal limits  BASIC METABOLIC PANEL    EKG None  Radiology No results found.  Procedures Procedures    Medications Ordered in ED Medications  glucagon (human recombinant) (GLUCAGEN) injection 1 mg (1 mg Intravenous Given 09/13/23 1010)    ED Course/ Medical Decision Making/ A&P                                 Medical Decision Making Patient with signs and symptoms strongly suggesting food bolus esophageal impaction, specifically steak which he consumed last night.  IV started, basic labs have been ordered, a dose of glucagon has not been effective.  He continues to  have to regurgitate saliva.  Amount and/or Complexity of Data Reviewed Labs: ordered.    Details: Labs obtained, CBC revealing for a WBC count of 13.1, is possible this is a stress reaction.   Radiology: ordered. Discussion of management or test interpretation with external provider(s): Call placed to Dr. Marletta Lor, he will gather his team and plan to be here within the next 30 to 45 minutes for this patient.  Risk Prescription drug management. Decision regarding hospitalization.           Final Clinical Impression(s) / ED Diagnoses Final diagnoses:  Esophageal obstruction due to food impaction    Rx / DC Orders ED Discharge Orders     None         Victoriano Lain 09/13/23 1208    Bethann Berkshire, MD 09/14/23 [phone]

## 2023-09-16 ENCOUNTER — Encounter (HOSPITAL_COMMUNITY): Payer: Self-pay | Admitting: Internal Medicine

## 2023-10-08 ENCOUNTER — Encounter: Payer: Self-pay | Admitting: Internal Medicine

## 2023-10-08 ENCOUNTER — Ambulatory Visit (INDEPENDENT_AMBULATORY_CARE_PROVIDER_SITE_OTHER): Admitting: Internal Medicine

## 2023-10-08 VITALS — BP 154/77 | HR 64 | Temp 97.8°F | Ht 69.0 in | Wt 198.4 lb

## 2023-10-08 DIAGNOSIS — W44F3XD Food entering into or through a natural orifice, subsequent encounter: Secondary | ICD-10-CM | POA: Diagnosis not present

## 2023-10-08 DIAGNOSIS — K2210 Ulcer of esophagus without bleeding: Secondary | ICD-10-CM | POA: Diagnosis not present

## 2023-10-08 DIAGNOSIS — K219 Gastro-esophageal reflux disease without esophagitis: Secondary | ICD-10-CM

## 2023-10-08 DIAGNOSIS — Z860100 Personal history of colon polyps, unspecified: Secondary | ICD-10-CM

## 2023-10-08 DIAGNOSIS — Z860101 Personal history of adenomatous and serrated colon polyps: Secondary | ICD-10-CM

## 2023-10-08 DIAGNOSIS — T18128D Food in esophagus causing other injury, subsequent encounter: Secondary | ICD-10-CM | POA: Diagnosis not present

## 2023-10-08 MED ORDER — PANTOPRAZOLE SODIUM 40 MG PO TBEC: 40.0000 mg | DELAYED_RELEASE_TABLET | Freq: Two times a day (BID) | ORAL | 11 refills | Status: AC

## 2023-10-08 NOTE — Patient Instructions (Signed)
 I am happy to hear that you are doing better.  Continue on pantoprazole twice daily.  I refilled this medication today.  We will tentatively plan a repeat upper endoscopy in 4 to 6 weeks to reevaluate.  We will call you to schedule this at that time.  It was very nice seeing you again today.  Dr. Marletta Lor

## 2023-10-08 NOTE — Progress Notes (Signed)
 Referring Provider: Richardean Chimera, MD Primary Care Physician:  Richardean Chimera, MD Primary GI:  Dr. Marletta Lor  Chief Complaint  Patient presents with   Follow-up    Patient here today for a follow up from recent Ed visit 09/13/2023 due to a food impaction.Patient denies any current issues. He was given pantoprazole 40 mg bid and says he is almost out of this.    HPI:   Ian Stout is a 68 y.o. male who presents to clinic today for follow-up visit.  Presented to [facility], ER 09/13/2023 for esophageal food impaction.  EGD 09/13/2023 with evidence of food in the lower third of the esophagus, removal with Talon grasper.  Upon reinspection, 1 esophageal ulcer nonbleeding, small amount of clot material is found at the GE junction.  Gastritis, normal duodenum.  Started on pantoprazole twice daily.  Today states he is doing well.  Denies any dysphagia.  Chopping of his food into small pieces.  Colonoscopy 11/02/2012 with 1 tubular adenoma removed from the sigmoid colon. No family history of colorectal malignancy.   Past Medical History:  Diagnosis Date   Hepatitis C    diagnosed in 2012    Past Surgical History:  Procedure Laterality Date   COLONOSCOPY N/A 11/02/2012   Procedure: COLONOSCOPY;  Surgeon: Malissa Hippo, MD;  Location: [facility];  Service: Endoscopy;  Laterality: N/A;  100-rescheduled to 730 Ann notified pt   ESOPHAGOGASTRODUODENOSCOPY (EGD) WITH PROPOFOL N/A 09/13/2023   Procedure: ESOPHAGOGASTRODUODENOSCOPY (EGD) WITH PROPOFOL;  Surgeon: Lanelle Bal, DO;  Location: [facility];  Service: Endoscopy;  Laterality: N/A;   NECK SURGERY     bone spur    Current Outpatient Medications  Medication Sig Dispense Refill   albuterol (PROVENTIL HFA;VENTOLIN HFA) 108 (90 BASE) MCG/ACT inhaler Inhale 2 puffs into the lungs every 6 (six) hours as needed for wheezing.     pantoprazole (PROTONIX) 40 MG tablet Take 1 tablet (40 mg total) by mouth 2 (two) times daily.  60 tablet 11   sildenafil (VIAGRA) 100 MG tablet Take 100 mg by mouth as needed for erectile dysfunction.     TRELEGY ELLIPTA 200-62.5-25 MCG/ACT AEPB Inhale 1 puff into the lungs daily.     No current facility-administered medications for this visit.    Allergies as of 10/08/2023 - Review Complete 10/08/2023  Allergen Reaction Noted   Cat dander Itching 09/13/2023    Family History  Problem Relation Age of Onset   Colon polyps Sister    Colon cancer Neg Hx     Social History   Socioeconomic History   Marital status: Married    Spouse name: Not on file   Number of children: Not on file   Years of education: Not on file   Highest education level: Not on file  Occupational History   Not on file  Tobacco Use   Smoking status: Every Day   Smokeless tobacco: Not on file  Vaping Use   Vaping status: Never Used  Substance and Sexual Activity   Alcohol use: Yes    Alcohol/week: 0.0 standard drinks of alcohol    Comment: rare beer after a round golf   Drug use: No   Sexual activity: Not on file  Other Topics Concern   Not on file  Social History Narrative   Not on file   Social Drivers of Health   Financial Resource Strain: Not on file  Food Insecurity: Not on file  Transportation Needs:  Not on file  Physical Activity: Not on file  Stress: Not on file  Social Connections: Not on file    Subjective: Review of Systems  Constitutional:  Negative for chills and fever.  HENT:  Negative for congestion and hearing loss.   Eyes:  Negative for blurred vision and double vision.  Respiratory:  Negative for cough and shortness of breath.   Cardiovascular:  Negative for chest pain and palpitations.  Gastrointestinal:  Positive for heartburn. Negative for abdominal pain, blood in stool, constipation, diarrhea, melena and vomiting.  Genitourinary:  Negative for dysuria and urgency.  Musculoskeletal:  Negative for joint pain and myalgias.  Skin:  Negative for itching and rash.   Neurological:  Negative for dizziness and headaches.  Psychiatric/Behavioral:  Negative for depression. The patient is not nervous/anxious.      Objective: BP (!) 154/77 (BP Location: Left Arm, Patient Position: Sitting, Cuff Size: Large)   Pulse 64   Temp 97.8 F (36.6 C) (Temporal)   Ht 5\' 9"  (1.753 m)   Wt 198 lb 6.4 oz (90 kg)   BMI 29.30 kg/m  Physical Exam Constitutional:      Appearance: Normal appearance.  HENT:     Head: Normocephalic and atraumatic.  Eyes:     Extraocular Movements: Extraocular movements intact.     Conjunctiva/sclera: Conjunctivae normal.  Cardiovascular:     Rate and Rhythm: Normal rate and regular rhythm.  Pulmonary:     Effort: Pulmonary effort is normal.     Breath sounds: Normal breath sounds.  Abdominal:     General: Bowel sounds are normal.     Palpations: Abdomen is soft.  Musculoskeletal:        General: Normal range of motion.     Cervical back: Normal range of motion and neck supple.  Skin:    General: Skin is warm.  Neurological:     General: No focal deficit present.     Mental Status: He is alert and oriented to person, place, and time.  Psychiatric:        Mood and Affect: Mood normal.        Behavior: Behavior normal.      Assessment: *Esophageal food impaction-subsequent encounter *GERD *Esophageal ulcer *History of adenomatous colon polyps-2014  Plan: Symptoms improved today.  Needs upper endoscopy to evaluate healing of esophageal ulcer as well as to screen for Barrett's esophagus.  Will tentatively plan on this in 4 to 6 weeks.  May elect to dilate esophagus depending on findings.   The risks including infection, bleed, or perforation as well as benefits, limitations, alternatives and imponderables have been reviewed with the patient. Potential for esophageal dilation, biopsy, etc. have also been reviewed.  Questions have been answered. All parties agreeable.   Continue pantoprazole twice daily.  Due for  colonoscopy as well.  Discussed with patient today.  He states he has been doing Cologuard testing which has been negative.  I counseled him that given his history of polyps prior, he needs surveillance colonoscopy but he would like to continue Cologuard.  Discussed risk involved with this and he understands.  10/08/2023 3:20 PM   Disclaimer: This note was dictated with voice recognition software. Similar sounding words can inadvertently be transcribed and may not be corrected upon review.

## 2023-10-28 ENCOUNTER — Telehealth: Payer: Self-pay | Admitting: *Deleted

## 2023-10-28 NOTE — Telephone Encounter (Signed)
 LMOVM to call back to schedule EGD/ED with Dr. Mordechai April, ASA 2 in 4-6 weeks after last OV

## 2023-11-10 NOTE — Telephone Encounter (Signed)
 [facility] letter mailed

## 2024-01-26 DIAGNOSIS — R7301 Impaired fasting glucose: Secondary | ICD-10-CM | POA: Diagnosis not present

## 2024-01-26 DIAGNOSIS — E7849 Other hyperlipidemia: Secondary | ICD-10-CM | POA: Diagnosis not present

## 2024-01-26 DIAGNOSIS — Z0001 Encounter for general adult medical examination with abnormal findings: Secondary | ICD-10-CM | POA: Diagnosis not present

## 2024-01-30 DIAGNOSIS — H40033 Anatomical narrow angle, bilateral: Secondary | ICD-10-CM | POA: Diagnosis not present

## 2024-01-30 DIAGNOSIS — H2513 Age-related nuclear cataract, bilateral: Secondary | ICD-10-CM | POA: Diagnosis not present

## 2024-08-11 ENCOUNTER — Other Ambulatory Visit (HOSPITAL_BASED_OUTPATIENT_CLINIC_OR_DEPARTMENT_OTHER): Payer: Self-pay | Admitting: Family Medicine

## 2024-08-11 DIAGNOSIS — Z87891 Personal history of nicotine dependence: Secondary | ICD-10-CM

## 2024-08-11 DIAGNOSIS — Z122 Encounter for screening for malignant neoplasm of respiratory organs: Secondary | ICD-10-CM

## 2024-08-18 ENCOUNTER — Ambulatory Visit (HOSPITAL_BASED_OUTPATIENT_CLINIC_OR_DEPARTMENT_OTHER)

## 2024-08-19 ENCOUNTER — Ambulatory Visit (HOSPITAL_BASED_OUTPATIENT_CLINIC_OR_DEPARTMENT_OTHER)
Admission: RE | Admit: 2024-08-19 | Discharge: 2024-08-19 | Disposition: A | Source: Ambulatory Visit | Attending: Family Medicine | Admitting: Family Medicine

## 2024-08-19 DIAGNOSIS — Z87891 Personal history of nicotine dependence: Secondary | ICD-10-CM

## 2024-08-19 DIAGNOSIS — Z122 Encounter for screening for malignant neoplasm of respiratory organs: Secondary | ICD-10-CM
